# Patient Record
Sex: Female | Born: 1970 | Race: Black or African American | Hispanic: No | Marital: Married | Smoking: Never smoker
Health system: Southern US, Community
[De-identification: ages and names within clinical notes are randomized; demographics above are authoritative.]

## PROBLEM LIST (undated history)

## (undated) DIAGNOSIS — G4733 Obstructive sleep apnea (adult) (pediatric): Secondary | ICD-10-CM

## (undated) DIAGNOSIS — E669 Obesity, unspecified: Secondary | ICD-10-CM

## (undated) DIAGNOSIS — E785 Hyperlipidemia, unspecified: Secondary | ICD-10-CM

## (undated) DIAGNOSIS — R079 Chest pain, unspecified: Secondary | ICD-10-CM

## (undated) DIAGNOSIS — M542 Cervicalgia: Secondary | ICD-10-CM

## (undated) DIAGNOSIS — I471 Supraventricular tachycardia, unspecified: Secondary | ICD-10-CM

## (undated) DIAGNOSIS — J329 Chronic sinusitis, unspecified: Secondary | ICD-10-CM

## (undated) DIAGNOSIS — I10 Essential (primary) hypertension: Secondary | ICD-10-CM

## (undated) DIAGNOSIS — H5440 Blindness, one eye, unspecified eye: Secondary | ICD-10-CM

## (undated) DIAGNOSIS — G8929 Other chronic pain: Secondary | ICD-10-CM

## (undated) DIAGNOSIS — R51 Headache: Secondary | ICD-10-CM

## (undated) DIAGNOSIS — R519 Headache, unspecified: Secondary | ICD-10-CM

## (undated) DIAGNOSIS — G4730 Sleep apnea, unspecified: Secondary | ICD-10-CM

## (undated) HISTORY — DX: Chest pain, unspecified: R07.9

## (undated) HISTORY — DX: Chronic sinusitis, unspecified: J32.9

## (undated) HISTORY — PX: CATARACT EXTRACTION: SUR2

## (undated) HISTORY — DX: Obstructive sleep apnea (adult) (pediatric): G47.33

## (undated) HISTORY — DX: Headache: R51

## (undated) HISTORY — DX: Obesity, unspecified: E66.9

## (undated) HISTORY — DX: Headache, unspecified: R51.9

## (undated) HISTORY — PX: BREAST REDUCTION SURGERY: SHX8

## (undated) HISTORY — DX: Essential (primary) hypertension: I10

## (undated) HISTORY — DX: Hyperlipidemia, unspecified: E78.5

## (undated) HISTORY — DX: Supraventricular tachycardia: I47.1

## (undated) HISTORY — DX: Other chronic pain: G89.29

## (undated) HISTORY — DX: Cervicalgia: M54.2

## (undated) HISTORY — DX: Sleep apnea, unspecified: G47.30

## (undated) HISTORY — DX: Supraventricular tachycardia, unspecified: I47.10

## (undated) HISTORY — DX: Blindness, one eye, unspecified eye: H54.40

---

## 1997-08-29 ENCOUNTER — Inpatient Hospital Stay (HOSPITAL_COMMUNITY): Admission: AD | Admit: 1997-08-29 | Discharge: 1997-08-29 | Payer: Self-pay | Admitting: *Deleted

## 1997-08-30 ENCOUNTER — Inpatient Hospital Stay (HOSPITAL_COMMUNITY): Admission: AD | Admit: 1997-08-30 | Discharge: 1997-08-30 | Payer: Self-pay | Admitting: *Deleted

## 1998-08-09 ENCOUNTER — Inpatient Hospital Stay (HOSPITAL_COMMUNITY): Admission: AD | Admit: 1998-08-09 | Discharge: 1998-08-09 | Payer: Self-pay | Admitting: Obstetrics and Gynecology

## 1998-08-10 ENCOUNTER — Encounter: Payer: Self-pay | Admitting: Obstetrics and Gynecology

## 1998-08-10 ENCOUNTER — Ambulatory Visit (HOSPITAL_COMMUNITY): Admission: RE | Admit: 1998-08-10 | Discharge: 1998-08-10 | Payer: Self-pay | Admitting: *Deleted

## 1998-09-03 ENCOUNTER — Other Ambulatory Visit: Admission: RE | Admit: 1998-09-03 | Discharge: 1998-09-03 | Payer: Self-pay | Admitting: Gynecology

## 1999-03-01 ENCOUNTER — Encounter: Payer: Self-pay | Admitting: Obstetrics & Gynecology

## 1999-03-01 ENCOUNTER — Inpatient Hospital Stay (HOSPITAL_COMMUNITY): Admission: AD | Admit: 1999-03-01 | Discharge: 1999-03-01 | Payer: Self-pay | Admitting: Obstetrics and Gynecology

## 1999-03-03 ENCOUNTER — Inpatient Hospital Stay (HOSPITAL_COMMUNITY): Admission: AD | Admit: 1999-03-03 | Discharge: 1999-03-03 | Payer: Self-pay | Admitting: Obstetrics & Gynecology

## 1999-03-06 ENCOUNTER — Inpatient Hospital Stay (HOSPITAL_COMMUNITY): Admission: AD | Admit: 1999-03-06 | Discharge: 1999-03-06 | Payer: Self-pay | Admitting: Obstetrics & Gynecology

## 1999-03-11 ENCOUNTER — Inpatient Hospital Stay (HOSPITAL_COMMUNITY): Admission: AD | Admit: 1999-03-11 | Discharge: 1999-03-14 | Payer: Self-pay | Admitting: Obstetrics and Gynecology

## 2000-01-09 ENCOUNTER — Inpatient Hospital Stay (HOSPITAL_COMMUNITY): Admission: EM | Admit: 2000-01-09 | Discharge: 2000-01-09 | Payer: Self-pay | Admitting: Obstetrics and Gynecology

## 2000-03-09 ENCOUNTER — Encounter: Payer: Self-pay | Admitting: Internal Medicine

## 2000-03-09 ENCOUNTER — Ambulatory Visit (HOSPITAL_COMMUNITY): Admission: RE | Admit: 2000-03-09 | Discharge: 2000-03-09 | Payer: Self-pay | Admitting: Internal Medicine

## 2001-07-23 ENCOUNTER — Other Ambulatory Visit: Admission: RE | Admit: 2001-07-23 | Discharge: 2001-07-23 | Payer: Self-pay | Admitting: Obstetrics and Gynecology

## 2001-11-05 ENCOUNTER — Encounter (HOSPITAL_BASED_OUTPATIENT_CLINIC_OR_DEPARTMENT_OTHER): Payer: Self-pay | Admitting: General Surgery

## 2001-11-10 ENCOUNTER — Ambulatory Visit (HOSPITAL_COMMUNITY): Admission: RE | Admit: 2001-11-10 | Discharge: 2001-11-10 | Payer: Self-pay | Admitting: General Surgery

## 2001-11-10 ENCOUNTER — Encounter (INDEPENDENT_AMBULATORY_CARE_PROVIDER_SITE_OTHER): Payer: Self-pay | Admitting: Specialist

## 2002-09-23 ENCOUNTER — Other Ambulatory Visit: Admission: RE | Admit: 2002-09-23 | Discharge: 2002-09-23 | Payer: Self-pay | Admitting: Obstetrics and Gynecology

## 2004-02-12 ENCOUNTER — Ambulatory Visit (HOSPITAL_BASED_OUTPATIENT_CLINIC_OR_DEPARTMENT_OTHER): Admission: RE | Admit: 2004-02-12 | Discharge: 2004-02-12 | Payer: Self-pay | Admitting: *Deleted

## 2004-02-12 ENCOUNTER — Ambulatory Visit (HOSPITAL_COMMUNITY): Admission: RE | Admit: 2004-02-12 | Discharge: 2004-02-12 | Payer: Self-pay | Admitting: *Deleted

## 2004-07-08 ENCOUNTER — Encounter: Admission: RE | Admit: 2004-07-08 | Discharge: 2004-07-08 | Payer: Self-pay | Admitting: Internal Medicine

## 2004-12-26 ENCOUNTER — Inpatient Hospital Stay (HOSPITAL_COMMUNITY): Admission: AD | Admit: 2004-12-26 | Discharge: 2004-12-27 | Payer: Self-pay | Admitting: Obstetrics & Gynecology

## 2005-07-30 ENCOUNTER — Other Ambulatory Visit: Admission: RE | Admit: 2005-07-30 | Discharge: 2005-07-30 | Payer: Self-pay | Admitting: Obstetrics and Gynecology

## 2005-08-04 ENCOUNTER — Observation Stay (HOSPITAL_COMMUNITY): Admission: EM | Admit: 2005-08-04 | Discharge: 2005-08-05 | Payer: Self-pay | Admitting: Emergency Medicine

## 2005-09-03 ENCOUNTER — Encounter: Admission: RE | Admit: 2005-09-03 | Discharge: 2005-12-02 | Payer: Self-pay | Admitting: Obstetrics and Gynecology

## 2006-06-15 ENCOUNTER — Emergency Department (HOSPITAL_COMMUNITY): Admission: EM | Admit: 2006-06-15 | Discharge: 2006-06-15 | Payer: Self-pay | Admitting: Emergency Medicine

## 2006-12-23 ENCOUNTER — Ambulatory Visit: Payer: Self-pay | Admitting: Psychiatry

## 2006-12-30 ENCOUNTER — Ambulatory Visit: Payer: Self-pay | Admitting: Psychiatry

## 2007-02-01 ENCOUNTER — Ambulatory Visit: Payer: Self-pay | Admitting: Psychiatry

## 2007-03-03 ENCOUNTER — Ambulatory Visit: Payer: Self-pay | Admitting: Psychiatry

## 2007-03-16 ENCOUNTER — Ambulatory Visit: Payer: Self-pay | Admitting: Psychiatry

## 2008-10-17 ENCOUNTER — Encounter: Admission: RE | Admit: 2008-10-17 | Discharge: 2008-10-17 | Payer: Self-pay | Admitting: Obstetrics and Gynecology

## 2009-12-10 ENCOUNTER — Emergency Department (HOSPITAL_COMMUNITY): Admission: EM | Admit: 2009-12-10 | Discharge: 2009-12-11 | Payer: Self-pay | Admitting: Emergency Medicine

## 2010-08-26 ENCOUNTER — Emergency Department (HOSPITAL_COMMUNITY)
Admission: EM | Admit: 2010-08-26 | Discharge: 2010-08-26 | Disposition: A | Discharge status: Home / Self Care | Payer: 59 | Attending: Emergency Medicine | Admitting: Emergency Medicine

## 2010-08-26 ENCOUNTER — Emergency Department (HOSPITAL_COMMUNITY): Payer: 59

## 2010-08-26 DIAGNOSIS — I498 Other specified cardiac arrhythmias: Secondary | ICD-10-CM | POA: Insufficient documentation

## 2010-08-26 DIAGNOSIS — R079 Chest pain, unspecified: Secondary | ICD-10-CM | POA: Insufficient documentation

## 2010-08-26 DIAGNOSIS — M79609 Pain in unspecified limb: Secondary | ICD-10-CM

## 2010-08-26 DIAGNOSIS — E7800 Pure hypercholesterolemia, unspecified: Secondary | ICD-10-CM | POA: Insufficient documentation

## 2010-08-26 DIAGNOSIS — R209 Unspecified disturbances of skin sensation: Secondary | ICD-10-CM | POA: Insufficient documentation

## 2010-08-26 DIAGNOSIS — I4891 Unspecified atrial fibrillation: Secondary | ICD-10-CM | POA: Insufficient documentation

## 2010-08-26 DIAGNOSIS — R002 Palpitations: Secondary | ICD-10-CM | POA: Insufficient documentation

## 2010-08-26 DIAGNOSIS — I10 Essential (primary) hypertension: Secondary | ICD-10-CM | POA: Insufficient documentation

## 2010-08-26 LAB — DIFFERENTIAL
Eosinophils Absolute: 0.1 10*3/uL (ref 0.0–0.7)
Monocytes Relative: 8 % (ref 3–12)
Neutro Abs: 3.8 10*3/uL (ref 1.7–7.7)

## 2010-08-26 LAB — POCT I-STAT, CHEM 8
BUN: 8 mg/dL (ref 6–23)
Calcium, Ion: 1.23 mmol/L (ref 1.12–1.32)
Chloride: 108 mEq/L (ref 96–112)
Creatinine, Ser: 1.1 mg/dL (ref 0.4–1.2)
Glucose, Bld: 119 mg/dL — ABNORMAL HIGH (ref 70–99)
Potassium: 4.6 mEq/L (ref 3.5–5.1)
TCO2: 25 mmol/L (ref 0–100)

## 2010-08-26 LAB — POCT CARDIAC MARKERS
CKMB, poc: 1.4 ng/mL (ref 1.0–8.0)
CKMB, poc: <1 ng/mL — ABNORMAL LOW (ref 1.0–8.0)
Myoglobin, poc: 53.1 ng/mL (ref 12–200)
Myoglobin, poc: 54.5 ng/mL (ref 12–200)
Troponin i, poc: <0.05 ng/mL (ref 0.00–0.09)

## 2010-08-26 LAB — CBC
Hemoglobin: 11.4 g/dL — ABNORMAL LOW (ref 12.0–15.0)
MCV: 74.2 fL — ABNORMAL LOW (ref 78.0–100.0)
Platelets: 181 10*3/uL (ref 150–400)
RBC: 4.89 MIL/uL (ref 3.87–5.11)
RDW: 17 % — ABNORMAL HIGH (ref 11.5–15.5)

## 2010-08-26 LAB — URINALYSIS, ROUTINE W REFLEX MICROSCOPIC
Bilirubin Urine: NEGATIVE
Ketones, ur: NEGATIVE mg/dL
Nitrite: NEGATIVE

## 2010-09-08 LAB — URINE MICROSCOPIC-ADD ON

## 2010-09-08 LAB — COMPREHENSIVE METABOLIC PANEL
ALT: 20 U/L (ref 0–35)
AST: 21 U/L (ref 0–37)
Albumin: 3.9 g/dL (ref 3.5–5.2)
Alkaline Phosphatase: 78 U/L (ref 39–117)
CO2: 30 mEq/L (ref 19–32)
GFR calc Af Amer: >60 mL/min (ref >60)
Glucose, Bld: 154 mg/dL — ABNORMAL HIGH (ref 70–99)
Sodium: 140 mEq/L (ref 135–145)

## 2010-09-08 LAB — DIFFERENTIAL
Basophils Absolute: 0 10*3/uL (ref 0.0–0.1)
Lymphocytes Relative: 17 % (ref 12–46)
Monocytes Relative: 7 % (ref 3–12)
Neutro Abs: 4.9 10*3/uL (ref 1.7–7.7)
Neutrophils Relative %: 73 % (ref 43–77)

## 2010-09-08 LAB — CBC: WBC: 6.7 10*3/uL (ref 4.0–10.5)

## 2010-09-08 LAB — URINALYSIS, ROUTINE W REFLEX MICROSCOPIC: pH: 5.5 (ref 5.0–8.0)

## 2010-09-08 LAB — URINE CULTURE

## 2010-09-08 LAB — PREGNANCY, URINE: Preg Test, Ur: NEGATIVE

## 2010-11-08 NOTE — Op Note (Signed)
[place]  Patient:    Evelyn Johnson, Evelyn Johnson Visit Number: [id number] MRN: [id number]          Service Type: Attending:  Luisa Hart L. Lurene Shadow, M.D. Dictated by:   Mardene Celeste. Lurene Shadow, M.D. Proc. Date: 11/10/01                             Operative Report  PREOPERATIVE DIAGNOSIS:  Bilateral submammary hidradenitis.  POSTOPERATIVE DIAGNOSIS:  Bilateral submammary hidradenitis.  OPERATION PERFORMED:  Excision of bilateral submammary hidradenitis.  SURGEON:  Mardene Celeste. Lurene Shadow, M.D.  ASSISTANT:  Nurse.  ANESTHESIA:  General.  INDICATIONS FOR PROCEDURE:  The patient is a 40 year old woman status post reduction mammoplasties who has now developed hidradenitis in the bilateral submammary spaces.  She presented originally with severely draining sinuses from both of these areas.  These were treated with antibiotics and local care. The infection and inflammation is now well under control and she is brought to the operating room for excision of these areas.  DESCRIPTION OF PROCEDURE:  Following the induction of satisfactory anesthesia, the patient was positioned supinely.  The breasts and submammary spaces were prepped and draped to be included in the sterile operative field.  I approached the hidradenitis by making a large elliptical incision extending from the sternal border laterally in the submammary space, deepening this incision down through the skin and subcutaneous tissues carrying them all the way down to the pectoralis and rectus fascias.  The hidradenitis first starting on the left side was excised in its entirety, removed and forwarded for pathologic evaluation.  Hemostasis was assured with electrocautery.  There were no additional areas of hidradenitis that could be seen grossly.  The subcutaneous tissues were then closed with interrupted 3-0 Vicryl sutures and the skin was closed with a running 4-0 Monocryl suture and then reinforced with  Steri-Strips.  Attention was then turned to the right side where a similar long elliptical incision was made surrounding the entire area of hydradenitis deepening the incision down to the anterior rectus fascia and to the pectoralis muscles.  Excising this large ellipse of skin with the hydradenitis intact.  No additional hidradenitis was noted.  Hemostasis was assured with electrocautery.  Sponge, instrument and sharp counts were completely verified.  The subcutaneous tissues were reapproximated with interrupted 3-0 Vicryl sutures and the skin was closed with running 4-0 Monocryl suture and then reinforced with Steri-Strips.  Sterile dressings were applied.  Anesthetic reversed.  Patient removed from the operating room to the recovery room in stable condition having tolerated the procedure well. Dictated by:   Mardene Celeste. Lurene Shadow, M.D. Attending:  Mardene Celeste. Lurene Shadow, M.D. DD:  11/10/01 TD:  11/10/01 Job: [id number]

## 2010-11-08 NOTE — H&P (Signed)
[name], AMEY NO.:  [id number]   MEDICAL RECORD NO.:  [id number]          PATIENT TYPE:  EMS   LOCATION:  MAJO                         FACILITY:  [facility]   PHYSICIAN:  Georga Hacking, M.D.DATE OF BIRTH:  1971/04/26   DATE OF ADMISSION:  08/04/2005  DATE OF DISCHARGE:                                HISTORY & PHYSICAL   REASON FOR ADMISSION:  Supraventricular tachycardia post cardioversion.   HISTORY:  This 40 year old black female has previously been in good health  except for obesity as well as hypertension.  She states her blood pressure  has not been well controlled and has been elevated at her primary doctor's  office.  She has had some neck discomfort recently and has been undergoing  physical therapy and a question has been raised about whether she needs an  MRI.  She has had some numbness in her right hand over the past several  days.  Today she had the onset of acute shortness of breath, chest  discomfort, and felt poorly and her co-workers at work called EMS.  When  they arrived she was in a rapid supraventricular tachycardia in the field.  For some reason she underwent sedation with Versed and cardioversion x2 in  the field but when she presented to the [facility] Emergency Room it was  evident still in supraventricular tachycardia.  She received adenosine  intravenously and converted to sinus rhythm, later received some metoprolol  5 mg intravenous push and is currently in sinus rhythm.  She currently feels  much better.  She is not currently having chest pain and has negative  cardiac enzymes on her initial tracing.  Post tachycardia EKG was  unremarkable.  She has no previous history of tachycardia and no previous  cardiac history.   PAST MEDICAL HISTORY:  1.  Hypertension.  2.  Obesity.  3.  She has been having the trouble with her neck as also noted previously.   PAST SURGICAL HISTORY:  1.  She has had cataract removal from her left  eye and is legally blind in      her left eye.  2.  She has had a breast biopsy also previously.   ALLERGIES:  None.   CURRENT MEDICATIONS:  Norvasc 5 mg daily.   FAMILY HISTORY:  Mother is 96 with hypertension.  Father died of a stroke at  age 82.  There is a history of hypertension and cardiac disease in the  family.   SOCIAL HISTORY:  She is a nonsmoker and does not use alcohol to excess.  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  Her husband works in  Educational psychologist.  She has one child 57 years old and attends [employer].   REVIEW OF SYSTEMS:  Has been obese for several years.  She is legally blind  in her left eye.  She has no difficulty hearing.  She has no difficulty  swallowing.  She has had some upper respiratory infections.  She denies PND  or orthopnea.  No syncope, edema, or previous  palpitations or claudication.  She has noted taking ibuprofen that might have irritated her stomach.  She  has no genitourinary problems.  She has irregular periods and spotting with  marked irregularity.  She does not have any significant arthritis.  No  history of stroke or TIA.  Other than noted above, the remainder of review  of systems is unremarkable.   PHYSICAL EXAMINATION:  GENERAL:  She is an obese female who is pleasant,  alert, in no acute distress.  VITAL SIGNS:  Blood pressure is currently 101/52.  When she arrived it was  155/84.  SKIN:  Warm and dry.  HEENT:  EOMI.  The left pupil is cloudy.  Her pharynx is negative.  NECK:  Supple without masses, JVD, thyromegaly, or bruits.  LUNGS:  Clear.  CARDIAC:  Regular rhythm.  Normal S1, S2.  No S3 or murmur.  ABDOMEN:  Soft and nontender.  EXTREMITIES:  Distal pulses 2+.  No edema noted.   EKG per evaluation shows supraventricular tachycardia, marked ST  abnormality.  Post EKG shows minor nonspecific changes.  Laboratory work  showed normal CBC.  D-dimer was negative.  I-stat showed a glucose of  112,  otherwise unremarkable.  Initial point of care enzymes was completely  normal.   IMPRESSION:  1.  Supraventricular tachycardia which is resolved.  2.  Hypertension.  3.  Obesity.  4.  Neck pain consistent with cervical disk disease versus non-neurologic.   RECOMMENDATIONS:  The patient was cardioverted twice in the field without  having been given adenosine.  She did respond to adenosine.  I think she  needs to be observed overnight for recurrence to check serial enzymes.  While she is in will get an MRI.  She obviously will need to lose weight.  States she had thyroids done at her recent primary doctor's office.      Georga Hacking, M.D.  Electronically Signed     WST/MEDQ  D:  08/04/2005  T:  08/04/2005  Job:  [id number]   cc:   Juline Patch, M.D.  Fax: [phone]

## 2010-11-08 NOTE — Op Note (Signed)
NAME:  Evelyn Johnson, Evelyn Johnson                          ACCOUNT NO.:  [id number]   MEDICAL RECORD NO.:  [id number]                   PATIENT TYPE:  AMB   LOCATION:  DSC                                  FACILITY:  [facility]   PHYSICIAN:  Vikki Ports, M.D.         DATE OF BIRTH:  1971-06-05   DATE OF PROCEDURE:  02/12/2004  DATE OF DISCHARGE:                                 OPERATIVE REPORT   PREOPERATIVE DIAGNOSIS:  Hydradenitis suprativa of the right inframammary  region.   POSTOPERATIVE DIAGNOSIS:  Hydradenitis suprativa of the right inframammary  region.   PROCEDURE:  Excisions of hydradenitis of the right inframammary fold.   SURGEON:  Vikki Ports, M.D.   ANESTHESIA:  General.   DESCRIPTION OF PROCEDURE:  The patient was taken to the operating room and  placed in the supine position.  After adequate anesthesia was induced using  the endotracheal tube, the right breast and chest were prepped and draped in  the normal sterile fashion using an elliptical incision, excising the comedo  in the plain tissue in the medial aspect of the inframammary fold down to  the subcutaneous fat.  This was done en block.  Adequate hemostasis was  ensured and the skin was reapproximated using interrupted 3-0 nylon vertical  mattress sutures.  Sterile dressing was applied.  The patient tolerated the  procedure well and went to PACU in good condition.                                               Vikki Ports, M.D.    KRH/MEDQ  D:  02/12/2004  T:  02/12/2004  Job:  [id number]

## 2011-01-23 ENCOUNTER — Encounter: Payer: Self-pay | Admitting: Pulmonary Disease

## 2011-01-24 ENCOUNTER — Ambulatory Visit (INDEPENDENT_AMBULATORY_CARE_PROVIDER_SITE_OTHER): Payer: BC Managed Care – PPO | Admitting: Pulmonary Disease

## 2011-01-24 ENCOUNTER — Encounter: Payer: Self-pay | Admitting: Pulmonary Disease

## 2011-01-24 VITALS — BP 120/86 | HR 87 | Temp 98.3°F | Ht 64.0 in | Wt 224.2 lb

## 2011-01-24 DIAGNOSIS — R053 Chronic cough: Secondary | ICD-10-CM

## 2011-01-24 DIAGNOSIS — G4733 Obstructive sleep apnea (adult) (pediatric): Secondary | ICD-10-CM

## 2011-01-24 DIAGNOSIS — R059 Cough, unspecified: Secondary | ICD-10-CM

## 2011-01-24 DIAGNOSIS — R05 Cough: Secondary | ICD-10-CM

## 2011-01-24 MED ORDER — OMEPRAZOLE 20 MG PO CPDR: 20.0000 mg | DELAYED_RELEASE_CAPSULE | Freq: Every day | ORAL | Status: AC

## 2011-01-24 NOTE — Progress Notes (Signed)
  Subjective:    Patient ID: Evelyn Johnson, female    DOB: [birth year], 40 y.o.   MRN: [id number]  HPI 40/F ,never smoker referred for cough & snoring Cough has restarted x 3 mnths, ongoing x 65m, occ white phlegm tussionex liquid helps, makes her sleepy Prednisone, claritin, tussicaps, norel had no effect. No heartburn She has just moved from apt to house. ESS 13 Naps refreshing Long sleep latency - several hrs, takes cartia & crestor, flexeril (for neck spasms), hydrocodone for headaches. Took Ambien when father died. Bedtime 9.30 p, several awakenings, oob with child on school days, no daytime naps. She had a sleep study > 10 yrs ago & was given nasal spray for snoring. TSH , vitamin levels have been checked.  Review of Systems  Constitutional: Positive for unexpected weight change. Negative for fever.  HENT: Positive for congestion and sneezing. Negative for ear pain, nosebleeds, sore throat, rhinorrhea, trouble swallowing, dental problem, postnasal drip and sinus pressure.   Eyes: Negative for redness and itching.  Respiratory: Positive for cough and shortness of breath. Negative for chest tightness and wheezing.   Cardiovascular: Positive for palpitations. Negative for leg swelling.  Gastrointestinal: Negative for nausea and vomiting.  Genitourinary: Negative for dysuria.  Musculoskeletal: Positive for joint swelling.  Skin: Negative for rash.  Neurological: Positive for headaches.  Hematological: Does not bruise/bleed easily.  Psychiatric/Behavioral: Positive for dysphoric mood. The patient is nervous/anxious.        Objective:   Physical Exam Gen. Pleasant, well-nourished, in no distress, normal affect ENT - no lesions, no post nasal drip, swollen turbinates, class 3 airway Neck: No JVD, no thyromegaly, no carotid bruits Lungs: no use of accessory muscles, no dullness to percussion, clear without rales or rhonchi  Cardiovascular: Rhythm regular, heart sounds  normal, no  murmurs or gallops, no peripheral edema Abdomen: soft and non-tender, no hepatosplenomegaly, BS normal. Musculoskeletal: No deformities, no cyanosis or clubbing Neuro:  alert, non focal        Assessment & Plan:

## 2011-01-24 NOTE — Patient Instructions (Signed)
Sleep study at the lab Trial of omeprazole for acid suppression Nasonex spray each nare at bedtime - sample

## 2011-01-25 DIAGNOSIS — R053 Chronic cough: Secondary | ICD-10-CM | POA: Insufficient documentation

## 2011-01-25 DIAGNOSIS — R05 Cough: Secondary | ICD-10-CM | POA: Insufficient documentation

## 2011-01-25 NOTE — Assessment & Plan Note (Signed)
Trial of nasonex (swollen turbinates)  & PPI (omeprazole) x 4 weeks If no improvement consider allergy testing & antihistaminic- decongestant combination

## 2011-01-25 NOTE — Assessment & Plan Note (Signed)
Weight loss encouraged, compliance with goal of at least 4-6 hrs every night is the expectation. Advised against medications with sedative side effects Cautioned against driving when sleepy - understanding that sleepiness will vary on a day to day basis  

## 2011-01-30 ENCOUNTER — Encounter (HOSPITAL_BASED_OUTPATIENT_CLINIC_OR_DEPARTMENT_OTHER): Payer: Self-pay | Admitting: Emergency Medicine

## 2011-01-30 ENCOUNTER — Emergency Department (HOSPITAL_BASED_OUTPATIENT_CLINIC_OR_DEPARTMENT_OTHER)
Admission: EM | Admit: 2011-01-30 | Discharge: 2011-01-30 | Disposition: A | Discharge status: Home / Self Care | Payer: 59 | Attending: Emergency Medicine | Admitting: Emergency Medicine

## 2011-01-30 ENCOUNTER — Other Ambulatory Visit: Payer: Self-pay

## 2011-01-30 DIAGNOSIS — I471 Supraventricular tachycardia: Secondary | ICD-10-CM

## 2011-01-30 DIAGNOSIS — I10 Essential (primary) hypertension: Secondary | ICD-10-CM | POA: Insufficient documentation

## 2011-01-30 DIAGNOSIS — Z79899 Other long term (current) drug therapy: Secondary | ICD-10-CM | POA: Insufficient documentation

## 2011-01-30 DIAGNOSIS — E785 Hyperlipidemia, unspecified: Secondary | ICD-10-CM | POA: Insufficient documentation

## 2011-01-30 DIAGNOSIS — I498 Other specified cardiac arrhythmias: Secondary | ICD-10-CM | POA: Insufficient documentation

## 2011-01-30 LAB — CARDIAC PANEL(CRET KIN+CKTOT+MB+TROPI): Relative Index: 1.1 (ref 0.0–2.5)

## 2011-01-30 MED ORDER — ADENOSINE 6 MG/2ML IV SOLN
INTRAVENOUS | Status: AC
  Administered 2011-01-30: 6 mg via INTRAVENOUS
  Filled 2011-01-30: qty 6

## 2011-01-30 MED ORDER — GI COCKTAIL ~~LOC~~
30.0000 mL | Freq: Once | ORAL | Status: AC
  Administered 2011-01-30: 30 mL via ORAL
  Filled 2011-01-30: qty 30

## 2011-01-30 MED ORDER — ADENOSINE 6 MG/2ML IV SOLN
6.0000 mg | Freq: Once | INTRAVENOUS | Status: AC
  Administered 2011-01-30: 6 mg via INTRAVENOUS

## 2011-01-30 NOTE — ED Notes (Signed)
Pt to ED via EMS from MD office with c/o tachycardia/palpitations

## 2011-01-30 NOTE — ED Provider Notes (Signed)
History     CSN: [id number] Arrival date & time: 01/30/2011  6:47 PM  Chief Complaint  Patient presents with  . Tachycardia   The history is provided by the patient.  HPI This is a 40 year old female with a history of supraventricular tachycardia. She felt her heart rate increase about 5 hours ago consistent with past history of SVT. She presented to her primary care physician's office who was not comfortable treating her in his office and called EMS. EMS brought her here for definitive treatment. She's not having any chest pain she has some mild dyspnea and nausea no diaphoresis or vomiting. There no exacerbating or minute gating factors. The symptoms are mild to moderate.  Past Medical History  Diagnosis Date  . HTN (hypertension)   . Blind left eye   . Sinusitis   . Hyperlipidemia   . SVT (supraventricular tachycardia)     Past Surgical History  Procedure Date  . Breast reduction surgery     Family History  Problem Relation Age of Onset  . Stroke Father   . Breast cancer Maternal Grandmother     History  Substance Use Topics  . Smoking status: Never Smoker   . Smokeless tobacco: Not on file  . Alcohol Use: No    OB History    Grav Para Term Preterm Abortions TAB SAB Ect Mult Living                  Review of Systems  All other systems reviewed and are negative.    Physical Exam  BP 173/92  Pulse 183  Resp 20  SpO2 96%  LMP 08/30/2010  Physical Exam General: Well-developed, well-nourished female in no acute distress; appearance consistent with age of record HENT: normocephalic, atraumatic Eyes: pupils equal round and reactive to light; extraocular muscles intact Neck: supple Heart: regular rate and rhythm; tachycardic; monitor shows supraventricular tachycardia Lungs: clear to auscultation bilaterally Abdomen: soft; nontender; nondistended; no masses or hepatosplenomegaly; bowel sounds present Extremities: No deformity; full range of motion; pulses  normal; no edema Neurologic: Awake, alert and oriented;motor function intact in all extremities and symmetric;sensation grossly intact; no facial droop Skin: Warm and dry Psychiatric: Normal mood and affect   ED Course  CARDIOVERSION Date/Time: 01/30/2011 7:10 PM Performed by: Hanley Seamen Authorized by: Hanley Seamen Consent: Verbal consent obtained. Pre-procedure rhythm: supraventricular tachycardia Number of attempts: 1 Cardioversion mode attempt one: adenosine 6mg   IV push. Attempt 1 outcome: conversion to normal sinus rhythm Complications: no complications  Initial EKG: EKG Interpretation:  Date & Time: 01/30/2011 6:47 PM  Rate: 176  Rhythm: supraventricular tachycardia (SVT)  QRS Axis: normal  Intervals: normal  ST/T Wave abnormalities: nonspecific ST changes  Conduction Disutrbances: no P waves  Narrative Interpretation:   Old EKG Reviewed: previously NSR  Post cardioversion EKG: EKG Interpretation:  Date & Time: 01/30/2011 7:04 PM  Rate: 117  Rhythm: sinus tachycardia  QRS Axis: normal  Intervals: QT prolonged  ST/T Wave abnormalities: normal  Conduction Disutrbances:none  Narrative Interpretation:   Old EKG Reviewed: previously SVT  MDM Nursing notes and vitals signs, including pulse oximetry, reviewed.  Summary of this visit's results, reviewed by myself:  Labs:  Results for orders placed during the hospital encounter of 01/30/11  CARDIAC PANEL(CRET KIN+CKTOT+MB+TROPI)      Component Value Range   Total CK 158  7 - 177 (U/L)   CK, MB 1.8  0.3 - 4.0 (ng/mL)   Troponin I <0.30  <0.30 (  ng/mL)   Relative Index 1.1  0.0 - 2.5     Imaging Studies: No results found.

## 2011-01-30 NOTE — ED Notes (Signed)
Initial HR 186

## 2011-03-07 ENCOUNTER — Encounter (HOSPITAL_BASED_OUTPATIENT_CLINIC_OR_DEPARTMENT_OTHER): Payer: BC Managed Care – PPO

## 2011-03-31 ENCOUNTER — Emergency Department (HOSPITAL_COMMUNITY)
Admission: EM | Admit: 2011-03-31 | Discharge: 2011-03-31 | Disposition: A | Discharge status: Home / Self Care | Payer: 59 | Attending: Emergency Medicine | Admitting: Emergency Medicine

## 2011-03-31 DIAGNOSIS — Z79899 Other long term (current) drug therapy: Secondary | ICD-10-CM | POA: Insufficient documentation

## 2011-03-31 DIAGNOSIS — I10 Essential (primary) hypertension: Secondary | ICD-10-CM | POA: Insufficient documentation

## 2011-03-31 DIAGNOSIS — I498 Other specified cardiac arrhythmias: Secondary | ICD-10-CM | POA: Insufficient documentation

## 2011-03-31 LAB — POCT I-STAT, CHEM 8
BUN: 10 mg/dL (ref 6–23)
Creatinine, Ser: 1.1 mg/dL (ref 0.50–1.10)
Hemoglobin: 12.6 g/dL (ref 12.0–15.0)
Potassium: 3.8 mEq/L (ref 3.5–5.1)
Sodium: 143 mEq/L (ref 135–145)

## 2011-06-03 ENCOUNTER — Other Ambulatory Visit: Payer: Self-pay

## 2011-06-03 ENCOUNTER — Emergency Department (HOSPITAL_COMMUNITY): Payer: 59

## 2011-06-03 ENCOUNTER — Encounter (HOSPITAL_COMMUNITY): Payer: Self-pay | Admitting: *Deleted

## 2011-06-03 ENCOUNTER — Emergency Department (HOSPITAL_COMMUNITY)
Admission: EM | Admit: 2011-06-03 | Discharge: 2011-06-03 | Disposition: A | Discharge status: Home / Self Care | Payer: 59 | Attending: Emergency Medicine | Admitting: Emergency Medicine

## 2011-06-03 DIAGNOSIS — E785 Hyperlipidemia, unspecified: Secondary | ICD-10-CM | POA: Insufficient documentation

## 2011-06-03 DIAGNOSIS — I10 Essential (primary) hypertension: Secondary | ICD-10-CM | POA: Insufficient documentation

## 2011-06-03 DIAGNOSIS — I498 Other specified cardiac arrhythmias: Secondary | ICD-10-CM | POA: Insufficient documentation

## 2011-06-03 DIAGNOSIS — Z79899 Other long term (current) drug therapy: Secondary | ICD-10-CM | POA: Insufficient documentation

## 2011-06-03 DIAGNOSIS — R42 Dizziness and giddiness: Secondary | ICD-10-CM | POA: Insufficient documentation

## 2011-06-03 DIAGNOSIS — I471 Supraventricular tachycardia: Secondary | ICD-10-CM

## 2011-06-03 DIAGNOSIS — R0602 Shortness of breath: Secondary | ICD-10-CM | POA: Insufficient documentation

## 2011-06-03 DIAGNOSIS — R079 Chest pain, unspecified: Secondary | ICD-10-CM | POA: Insufficient documentation

## 2011-06-03 LAB — POCT I-STAT, CHEM 8
BUN: 16 mg/dL (ref 6–23)
Creatinine, Ser: 1.2 mg/dL — ABNORMAL HIGH (ref 0.50–1.10)
Hemoglobin: 14.6 g/dL (ref 12.0–15.0)
Potassium: 4.3 mEq/L (ref 3.5–5.1)
Sodium: 142 mEq/L (ref 135–145)

## 2011-06-03 LAB — BASIC METABOLIC PANEL
CO2: 26 mEq/L (ref 19–32)
Chloride: 103 mEq/L (ref 96–112)
GFR calc Af Amer: 73 mL/min — ABNORMAL LOW (ref >90)
Potassium: 4.4 mEq/L (ref 3.5–5.1)
Sodium: 138 mEq/L (ref 135–145)

## 2011-06-03 LAB — POCT I-STAT TROPONIN I: Troponin i, poc: 0.01 ng/mL (ref 0.00–0.08)

## 2011-06-03 MED ORDER — ADENOSINE 6 MG/2ML IV SOLN
INTRAVENOUS | Status: AC
  Administered 2011-06-03: 14:00:00
  Filled 2011-06-03: qty 4

## 2011-06-03 MED ORDER — ONDANSETRON HCL 4 MG/2ML IJ SOLN
4.0000 mg | Freq: Once | INTRAMUSCULAR | Status: AC
  Administered 2011-06-03: 4 mg via INTRAVENOUS
  Filled 2011-06-03: qty 2

## 2011-06-03 NOTE — ED Notes (Signed)
Pt denies c/o N/V.

## 2011-06-03 NOTE — ED Notes (Signed)
Pt reports cp x 3 hours, hx SVT. Has been here multiple xs for SVT.SOB w anxiety over "being in SVT"

## 2011-06-03 NOTE — ED Provider Notes (Addendum)
History     CSN: [id number] Arrival date & time: 06/03/2011  1:17 PM   First MD Initiated Contact with Patient 06/03/11 1326      Chief Complaint  Patient presents with  . Chest Pain   very pleasant 40 year old female with a known history of recurrent SVT, hypertension. She apparently is already on diltiazem and by systolic, as well as other medications. 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 This morning when her symptoms began approximately 3 hours ago. Symptoms mostly including palpitations, dizziness. Some mild shortness of breath, and mild chest discomfort. Patient has been converted, multiple times with adenosine in the past. She states this feels similar. Denies any numbness or tingling  (Consider location/radiation/quality/duration/timing/severity/associated sxs/prior treatment) HPI  Past Medical History  Diagnosis Date  . HTN (hypertension)   . Blind left eye   . Sinusitis   . Hyperlipidemia   . SVT (supraventricular tachycardia)     Past Surgical History  Procedure Date  . Breast reduction surgery     Family History  Problem Relation Age of Onset  . Stroke Father   . Breast cancer Maternal Grandmother     History  Substance Use Topics  . Smoking status: Never Smoker   . Smokeless tobacco: Not on file  . Alcohol Use: No    OB History    Grav Para Term Preterm Abortions TAB SAB Ect Mult Living                  Review of Systems  All other systems reviewed and are negative.    Allergies  Review of patient's allergies indicates no known allergies.  Home Medications   Current Outpatient Rx  Name Route Sig Dispense Refill  . CARISOPRODOL 350 MG PO TABS Oral Take 350 mg by mouth 3 (three) times daily as needed.      . CYCLOBENZAPRINE HCL 10 MG PO TABS Oral Take 10 mg by mouth 3 (three) times daily as needed. For muscle spasms     . DILTIAZEM HCL ER COATED BEADS 300 MG PO CP24 Oral Take 300 mg by mouth daily.     Carma Leaven M  PLUS PO TABS Oral Take 1 tablet by mouth daily.      . NEBIVOLOL HCL 10 MG PO TABS Oral Take 10 mg by mouth daily.     Marland Kitchen OMEPRAZOLE 20 MG PO CPDR Oral Take 1 capsule (20 mg total) by mouth daily. 60 capsule 1  . ROSUVASTATIN CALCIUM 5 MG PO TABS Oral Take 5 mg by mouth daily.      . TOBRAMYCIN-DEXAMETHASONE 0.3-0.1 % OP SUSP Left Eye Place 1 drop into the left eye 2 (two) times daily as needed. For eye irritation       BP 144/117  Pulse 185  Temp(Src) 97.1 F (36.2 C) (Oral)  Resp 22  Ht 5\' 5"  (1.651 m)  Wt 290 lb (131.543 kg)  BMI 48.26 kg/m2  SpO2 98%  Physical Exam  Nursing note and vitals reviewed. Constitutional: She is oriented to person, place, and time. She appears well-developed and well-nourished.  HENT:  Head: Normocephalic and atraumatic.  Eyes: Conjunctivae and EOM are normal. Pupils are equal, round, and reactive to light.  Neck: Neck supple.  Cardiovascular: Exam reveals no gallop and no friction rub.   No murmur heard.      Tachycardic rates. Appears to be regular. Pulses are normal.  Pulmonary/Chest: Breath sounds normal. She has no wheezes. She  has no rales. She exhibits no tenderness.  Abdominal: Soft. Bowel sounds are normal. She exhibits no distension. There is no tenderness. There is no rebound and no guarding.  Musculoskeletal: Normal range of motion.  Neurological: She is alert and oriented to person, place, and time. No cranial nerve deficit. Coordination normal.  Skin: Skin is warm and dry. No rash noted.  Psychiatric: She has a normal mood and affect.    ED Course  Procedures (including critical care time):  Cardioversion CRITICAL CARE Performed by: Valma Cava A.   Total critical care time: 30  Critical care time was exclusive of separately billable procedures and treating other patients.  Critical care was necessary to treat or prevent imminent or life-threatening deterioration.  Critical care was time spent personally by me on the  following activities: development of treatment plan with patient and/or surrogate as well as nursing, discussions with consultants, evaluation of patient's response to treatment, examination of patient, obtaining history from patient or surrogate, ordering and performing treatments and interventions, ordering and review of laboratory studies, ordering and review of radiographic studies, pulse oximetry and re-evaluation of patient's condition.   Labs Reviewed  BASIC METABOLIC PANEL - Abnormal; Notable for the following:    Glucose, Bld 108 (*)    GFR calc non Af Amer 63 (*)    GFR calc Af Amer 73 (*)    All other components within normal limits  POCT I-STAT, CHEM 8 - Abnormal; Notable for the following:    Creatinine, Ser 1.20 (*)    Glucose, Bld 107 (*)    All other components within normal limits  POCT I-STAT TROPONIN I  I-STAT TROPONIN I  I-STAT, CHEM 8   Dg Chest Port 1 View  06/03/2011  *RADIOLOGY REPORT*  Clinical Data: Supraventricular tachycardia.  Nonsmoker.  History of breast reduction 20 years ago.  PORTABLE CHEST - 1 VIEW  Comparison: 08/26/2010  Findings: Patient rotated to the right. Midline trachea.  Normal heart size.  No pleural effusion or pneumothorax.  Low lung volumes with resultant pulmonary interstitial prominence.  Clear lungs.  No congestive failure.  IMPRESSION: Low lung volumes without acute disease.  Original Report Authenticated By: Consuello Bossier, M.D.     No diagnosis found.    MDM  Pt is seen and examined;  Initial history and physical completed.  Will follow.          Amoura Ransier A. Patrica Duel, MD 06/03/11 1337  Procedures:  Chemical Cardioversion with Adenosine   3:07 PM Patient was given 6 mg of adenosine rapid IV push. She did convert from SVT to sinus tachycardia with a rate of 117 beats per minute. EKG also showed nonspecific ST changes and LVH. Will continue with current plan including   CRITICAL CARE Performed by: Valma Cava  A.   Total critical care time: 30  Critical care time was exclusive of separately billable procedures and treating other patients.  Critical care was necessary to treat or prevent imminent or life-threatening deterioration.  Critical care was time spent personally by me on the following activities: development of treatment plan with patient and/or surrogate as well as nursing, discussions with consultants, evaluation of patient's response to treatment, examination of patient, obtaining history from patient or surrogate, ordering and performing treatments and interventions, ordering and review of laboratory studies, ordering and review of radiographic studies, pulse oximetry and re-evaluation of patient's conditio   Results for orders placed during the hospital encounter of 06/03/11  BASIC METABOLIC PANEL  Component Value Range   Sodium 138  135 - 145 (mEq/L)   Potassium 4.4  3.5 - 5.1 (mEq/L)   Chloride 103  96 - 112 (mEq/L)   CO2 26  19 - 32 (mEq/L)   Glucose, Bld 108 (*) 70 - 99 (mg/dL)   BUN 14  6 - 23 (mg/dL)   Creatinine, Ser 9.60  0.50 - 1.10 (mg/dL)   Calcium 45.4  8.4 - 10.5 (mg/dL)   GFR calc non Af Amer 63 (*) >90 (mL/min)   GFR calc Af Amer 73 (*) >90 (mL/min)  POCT I-STAT, CHEM 8      Component Value Range   Sodium 142  135 - 145 (mEq/L)   Potassium 4.3  3.5 - 5.1 (mEq/L)   Chloride 106  96 - 112 (mEq/L)   BUN 16  6 - 23 (mg/dL)   Creatinine, Ser 0.98 (*) 0.50 - 1.10 (mg/dL)   Glucose, Bld 119 (*) 70 - 99 (mg/dL)   Calcium, Ion 1.47  8.29 - 1.32 (mmol/L)   TCO2 29  0 - 100 (mmol/L)   Hemoglobin 14.6  12.0 - 15.0 (g/dL)   HCT 56.2  13.0 - 86.5 (%)  POCT I-STAT TROPONIN I      Component Value Range   Troponin i, poc 0.01  0.00 - 0.08 (ng/mL)   Comment 3            Dg Chest Port 1 View  06/03/2011  *RADIOLOGY REPORT*  Clinical Data: Supraventricular tachycardia.  Nonsmoker.  History of breast reduction 20 years ago.  PORTABLE CHEST - 1 VIEW  Comparison:  08/26/2010  Findings: Patient rotated to the right. Midline trachea.  Normal heart size.  No pleural effusion or pneumothorax.  Low lung volumes with resultant pulmonary interstitial prominence.  Clear lungs.  No congestive failure.  IMPRESSION: Low lung volumes without acute disease.  Original Report Authenticated By: Consuello Bossier, M.D.    Patient remains stable, heart rate down to 100. Patient having mild nausea and was given Zofran. We'll continue to follow, anticipate discharge home  Sarajane Fambrough A. Patrica Duel, MD 06/03/11 1502  Theron Arista A. Patrica Duel, MD 06/03/11 1508   Date: 06/03/2011   13:22   Rate: 180  Rhythm: supraventricular tachycardia (SVT)  QRS Axis: normal  Intervals: normal  ST/T Wave abnormalities: nonspecific ST/T changes  Conduction Disutrbances:LVH  Narrative Interpretation:   Old EKG Reviewed: changes noted    Date: 06/03/2011             13:42   Rate: 117  Rhythm: sinus tachycardia  QRS Axis: normal  Intervals: normal  ST/T Wave abnormalities: nonspecific ST/T changes  Conduction Disutrbances:none and LVH  Narrative Interpretation:   Old EKG Reviewed: changes noted      Angelina Neece A. Patrica Duel, MD 06/03/11 1525   3:45 PM Dr. Michaelle Copas office was contacted. Dr. Katrinka Blazing was not readily available, however, the office staff stated they would contact the patient home to arrange close outpatient followup. This week. Patient is stable, jovial, heart rate normalized. No acute distress. Will be discharged home in stable improved condition  Brynlea Spindler A. Patrica Duel, MD 06/03/11 1545

## 2012-01-03 ENCOUNTER — Emergency Department (HOSPITAL_BASED_OUTPATIENT_CLINIC_OR_DEPARTMENT_OTHER)
Admission: EM | Admit: 2012-01-03 | Discharge: 2012-01-03 | Disposition: A | Discharge status: Home / Self Care | Payer: 59 | Attending: Emergency Medicine | Admitting: Emergency Medicine

## 2012-01-03 ENCOUNTER — Encounter (HOSPITAL_BASED_OUTPATIENT_CLINIC_OR_DEPARTMENT_OTHER): Payer: Self-pay | Admitting: Emergency Medicine

## 2012-01-03 DIAGNOSIS — I10 Essential (primary) hypertension: Secondary | ICD-10-CM | POA: Insufficient documentation

## 2012-01-03 DIAGNOSIS — R000 Tachycardia, unspecified: Secondary | ICD-10-CM | POA: Insufficient documentation

## 2012-01-03 DIAGNOSIS — F411 Generalized anxiety disorder: Secondary | ICD-10-CM | POA: Insufficient documentation

## 2012-01-03 DIAGNOSIS — R002 Palpitations: Secondary | ICD-10-CM | POA: Insufficient documentation

## 2012-01-03 DIAGNOSIS — R42 Dizziness and giddiness: Secondary | ICD-10-CM | POA: Insufficient documentation

## 2012-01-03 DIAGNOSIS — I471 Supraventricular tachycardia: Secondary | ICD-10-CM

## 2012-01-03 DIAGNOSIS — E785 Hyperlipidemia, unspecified: Secondary | ICD-10-CM | POA: Insufficient documentation

## 2012-01-03 LAB — COMPREHENSIVE METABOLIC PANEL
ALT: 21 U/L (ref 0–35)
AST: 26 U/L (ref 0–37)
Albumin: 3.8 g/dL (ref 3.5–5.2)
Alkaline Phosphatase: 93 U/L (ref 39–117)
BUN: 14 mg/dL (ref 6–23)
Chloride: 108 mEq/L (ref 96–112)
Potassium: 4.4 mEq/L (ref 3.5–5.1)
Sodium: 142 mEq/L (ref 135–145)
Total Bilirubin: 0.1 mg/dL — ABNORMAL LOW (ref 0.3–1.2)
Total Protein: 7.8 g/dL (ref 6.0–8.3)

## 2012-01-03 LAB — CBC WITH DIFFERENTIAL/PLATELET
Basophils Absolute: 0.1 10*3/uL (ref 0.0–0.1)
Eosinophils Absolute: 0.2 10*3/uL (ref 0.0–0.7)
HCT: 38.1 % (ref 36.0–46.0)
Hemoglobin: 12.5 g/dL (ref 12.0–15.0)
Lymphocytes Relative: 34 % (ref 12–46)
Monocytes Relative: 7 % (ref 3–12)
Neutro Abs: 3.3 10*3/uL (ref 1.7–7.7)
Neutrophils Relative %: 55 % (ref 43–77)
RDW: 17.8 % — ABNORMAL HIGH (ref 11.5–15.5)
WBC: 6 10*3/uL (ref 4.0–10.5)

## 2012-01-03 LAB — PROTIME-INR: Prothrombin Time: 13.6 seconds (ref 11.6–15.2)

## 2012-01-03 MED ORDER — ADENOSINE 6 MG/2ML IV SOLN: 6.0000 mg | Freq: Once | INTRAVENOUS | Status: DC

## 2012-01-03 MED ORDER — SODIUM CHLORIDE 0.9 % IV SOLN
Freq: Once | INTRAVENOUS | Status: AC
  Administered 2012-01-03: 21:00:00 via INTRAVENOUS

## 2012-01-03 NOTE — ED Notes (Signed)
Patient submerged face in bucket of ice.  SVT went to ST at a rate of 112. Patient denies SOB, dizziness, or nausea.

## 2012-01-03 NOTE — ED Provider Notes (Signed)
History   This chart was scribed for Gerhard Munch, MD by Shari Heritage. The patient was seen in room MH09/MH09. Patient's care was started at 2052.     CSN: [id number]  Arrival date & time 01/03/12  2052   First MD Initiated Contact with Patient 01/03/12 2058      Chief Complaint  Patient presents with  . Palpitations    (Consider location/radiation/quality/duration/timing/severity/associated sxs/prior treatment) Patient is a 41 y.o. female presenting with palpitations. The history is provided by the patient. No language interpreter was used.  Palpitations  This is a recurrent problem. The current episode started 6 to 12 hours ago. The problem occurs constantly. The problem has not changed since onset.The problem is associated with anxiety and stress. Associated symptoms include nausea and dizziness. Pertinent negatives include no fever, no chest pain, no near-syncope, no syncope, no back pain, no cough and no shortness of breath. Risk factors: HTN. Her past medical history does not include heart disease or hyperthyroidism. Past medical history comments: SVT.   Evelyn Johnson is a 41 y.o. female who presents to the Emergency Department complaining of rapid heart beat (palpitations) with associated dizziness and nausea onset 6 hours ago. Patient says that she attempted several home remedy maneuvers today without relief. Patient says that this episode is very similar to past SVT episodes. Patient states that she suffers from depression and so she stays at home a lot. 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 Patient says that she also experiences throbbing in her left eye. Patient states that needs a left eye removal. Patient with h/o SVT (most recent in December 2012), HTN, left eye blindness, neck spasms, depression, sinusitis, and hyperlipidemia. Patient with surgical h/o breat reduction surgery.  Past Medical History  Diagnosis Date  . HTN (hypertension)   .  Blind left eye   . Sinusitis   . Hyperlipidemia   . SVT (supraventricular tachycardia)     Past Surgical History  Procedure Date  . Breast reduction surgery     Family History  Problem Relation Age of Onset  . Stroke Father   . Breast cancer Maternal Grandmother     History  Substance Use Topics  . Smoking status: Never Smoker   . Smokeless tobacco: Not on file  . Alcohol Use: No    OB History    Grav Para Term Preterm Abortions TAB SAB Ect Mult Living                  Review of Systems  Constitutional: Negative for fever.  HENT: Negative for congestion.   Respiratory: Negative for cough and shortness of breath.   Cardiovascular: Positive for palpitations. Negative for chest pain, syncope and near-syncope.  Gastrointestinal: Positive for nausea.  Genitourinary: Negative for flank pain.  Musculoskeletal: Negative for back pain.  Skin: Negative for rash.  Neurological: Positive for dizziness.  Psychiatric/Behavioral: The patient is not nervous/anxious.     Allergies  Review of patient's allergies indicates no known allergies.  Home Medications   Current Outpatient Rx  Name Route Sig Dispense Refill  . CARISOPRODOL 350 MG PO TABS Oral Take 350 mg by mouth 3 (three) times daily as needed. For muscle spasms    . CYCLOBENZAPRINE HCL 10 MG PO TABS Oral Take 10 mg by mouth 3 (three) times daily as needed. For muscle spasms     . DILTIAZEM HCL ER 240 MG PO CP24 Oral Take 240 mg by mouth daily.    Marland Kitchen  THERA M PLUS PO TABS Oral Take 1 tablet by mouth daily.      . NEBIVOLOL HCL 10 MG PO TABS Oral Take 10 mg by mouth daily.     Marland Kitchen OMEPRAZOLE 20 MG PO CPDR Oral Take 1 capsule (20 mg total) by mouth daily. 60 capsule 1  . ROSUVASTATIN CALCIUM 5 MG PO TABS Oral Take 5 mg by mouth daily.      . TOBRAMYCIN-DEXAMETHASONE 0.3-0.1 % OP SUSP Left Eye Place 1 drop into the left eye 2 (two) times daily as needed. For eye pain      BP 157/126  Pulse 178  Temp 98.1 F (36.7 C)  (Oral)  Resp 16  Ht 5\' 5"  (1.651 m)  SpO2 99%  Physical Exam  Nursing note and vitals reviewed. Constitutional: She is oriented to person, place, and time. She appears well-developed and well-nourished. No distress.  HENT:  Head: Normocephalic and atraumatic.  Eyes: Conjunctivae and EOM are normal.  Cardiovascular: Regular rhythm.  Tachycardia present.   Pulmonary/Chest: Effort normal and breath sounds normal. No stridor. No respiratory distress.  Abdominal: She exhibits no distension.  Musculoskeletal: She exhibits no edema.  Neurological: She is alert and oriented to person, place, and time. No cranial nerve deficit.  Skin: Skin is warm and dry.  Psychiatric: She has a normal mood and affect.    ED Course  Procedures (including critical care time) DIAGNOSTIC STUDIES: Oxygen Saturation is 99% on room air, normal by my interpretation.    Cardiac: 171 - svt   Date: 01/03/2012 #1  Rate: 172  Rhythm: supraventricular tachycardia (SVT)  QRS Axis: normal  Intervals: svt  ST/T Wave abnormalities: nonspecific ST/T changes  Conduction Disutrbances:nonspecific intraventricular conduction delay  Narrative Interpretation:   Old EKG Reviewed: changes noted ABNORMAL   COORDINATION OF CARE: 9:03PM- Patient informed of current plan for treatment and evaluation and agrees with plan at this time. Will order labs and EKG. Patient's HR upon arrival was 178.  10:09PM- Patient was converted with ice water. Patient's HR is now 103.    Date: 01/03/2012 #2  Rate: 96  Rhythm: normal sinus rhythm  QRS Axis: normal  Intervals: QT prolonged  ST/T Wave abnormalities: nonspecific ST/T changes  Conduction Disutrbances:nonspecific intraventricular conduction delay  Narrative Interpretation:   Old EKG Reviewed: changes noted ABNORMAL - CHANGED    Results for orders placed during the hospital encounter of 01/03/12  CBC WITH DIFFERENTIAL      Component Value Range   WBC 6.0  4.0 - 10.5 K/uL     RBC 5.21 (*) 3.87 - 5.11 MIL/uL   Hemoglobin 12.5  12.0 - 15.0 g/dL   HCT 46.9  62.9 - 52.8 %   MCV 73.1 (*) 78.0 - 100.0 fL   MCH 24.0 (*) 26.0 - 34.0 pg   MCHC 32.8  30.0 - 36.0 g/dL   RDW 41.3 (*) 24.4 - 01.0 %   Platelets 202  150 - 400 K/uL   Neutrophils Relative 55  43 - 77 %   Lymphocytes Relative 34  12 - 46 %   Monocytes Relative 7  3 - 12 %   Eosinophils Relative 3  0 - 5 %   Basophils Relative 1  0 - 1 %   Neutro Abs 3.3  1.7 - 7.7 K/uL   Lymphs Abs 2.0  0.7 - 4.0 K/uL   Monocytes Absolute 0.4  0.1 - 1.0 K/uL   Eosinophils Absolute 0.2  0.0 - 0.7 K/uL  Basophils Absolute 0.1  0.0 - 0.1 K/uL   Smear Review PLATELET COUNT CONFIRMED BY SMEAR    COMPREHENSIVE METABOLIC PANEL      Component Value Range   Sodium 142  135 - 145 mEq/L   Potassium 4.4  3.5 - 5.1 mEq/L   Chloride 108  96 - 112 mEq/L   CO2 23  19 - 32 mEq/L   Glucose, Bld 141 (*) 70 - 99 mg/dL   BUN 14  6 - 23 mg/dL   Creatinine, Ser 1.61 (*) 0.50 - 1.10 mg/dL   Calcium 9.8  8.4 - 09.6 mg/dL   Total Protein 7.8  6.0 - 8.3 g/dL   Albumin 3.8  3.5 - 5.2 g/dL   AST 26  0 - 37 U/L   ALT 21  0 - 35 U/L   Alkaline Phosphatase 93  39 - 117 U/L   Total Bilirubin 0.1 (*) 0.3 - 1.2 mg/dL   GFR calc non Af Amer 55 (*) >90 mL/min   GFR calc Af Amer 64 (*) >90 mL/min  PROTIME-INR      Component Value Range   Prothrombin Time 13.6  11.6 - 15.2 seconds   INR 1.02  0.00 - 1.49  APTT      Component Value Range   aPTT 33  24 - 37 seconds    No results found.   No diagnosis found.    MDM  I personally performed the services described in this documentation, which was scribed in my presence. The recorded information has been reviewed and considered.  This 41 year old female with history of prior episodes of SVT now presents with ongoing palpitations.  On my initial exam the patient is notably tachycardic, with EKG consistent with SVT.  The patient is mentating clearly, speaking competently.  Given the lack of  distress, the patient had labs to evaluate for possible electrolyte abnormalities well preparing for adenosine cardioversion.  The patient received ice packs for a hopeful vagal response. this achieved cardioversion, and the patient did not require adenosine.  She was discharged in stable condition after the completion of IV fluids.  Gerhard Munch, MD 01/03/12 2237

## 2012-01-03 NOTE — ED Notes (Signed)
States started having a rapid heart beat, dizziness, and nausea around 3pm.  Has tried several home remedy maneuvers without relief.

## 2012-01-22 ENCOUNTER — Other Ambulatory Visit: Payer: 59

## 2012-01-22 ENCOUNTER — Other Ambulatory Visit: Payer: Self-pay | Admitting: Internal Medicine

## 2012-01-22 DIAGNOSIS — R413 Other amnesia: Secondary | ICD-10-CM

## 2012-01-29 ENCOUNTER — Ambulatory Visit
Admission: RE | Admit: 2012-01-29 | Discharge: 2012-01-29 | Disposition: A | Discharge status: Home / Self Care | Payer: 59 | Source: Ambulatory Visit | Attending: Internal Medicine | Admitting: Internal Medicine

## 2012-01-29 DIAGNOSIS — R413 Other amnesia: Secondary | ICD-10-CM

## 2012-01-29 MED ORDER — GADOBENATE DIMEGLUMINE 529 MG/ML IV SOLN
20.0000 mL | Freq: Once | INTRAVENOUS | Status: AC | PRN
  Administered 2012-01-29: 20 mL via INTRAVENOUS

## 2012-02-04 ENCOUNTER — Emergency Department (HOSPITAL_BASED_OUTPATIENT_CLINIC_OR_DEPARTMENT_OTHER): Payer: 59

## 2012-02-04 ENCOUNTER — Emergency Department (HOSPITAL_BASED_OUTPATIENT_CLINIC_OR_DEPARTMENT_OTHER)
Admission: EM | Admit: 2012-02-04 | Discharge: 2012-02-05 | Disposition: A | Discharge status: Home / Self Care | Payer: 59 | Attending: Emergency Medicine | Admitting: Emergency Medicine

## 2012-02-04 ENCOUNTER — Encounter (HOSPITAL_BASED_OUTPATIENT_CLINIC_OR_DEPARTMENT_OTHER): Payer: Self-pay | Admitting: Emergency Medicine

## 2012-02-04 DIAGNOSIS — H5440 Blindness, one eye, unspecified eye: Secondary | ICD-10-CM | POA: Insufficient documentation

## 2012-02-04 DIAGNOSIS — E785 Hyperlipidemia, unspecified: Secondary | ICD-10-CM | POA: Insufficient documentation

## 2012-02-04 DIAGNOSIS — R079 Chest pain, unspecified: Secondary | ICD-10-CM

## 2012-02-04 DIAGNOSIS — R42 Dizziness and giddiness: Secondary | ICD-10-CM | POA: Insufficient documentation

## 2012-02-04 DIAGNOSIS — I10 Essential (primary) hypertension: Secondary | ICD-10-CM | POA: Insufficient documentation

## 2012-02-04 LAB — COMPREHENSIVE METABOLIC PANEL
Alkaline Phosphatase: 92 U/L (ref 39–117)
BUN: 11 mg/dL (ref 6–23)
Chloride: 103 mEq/L (ref 96–112)
Creatinine, Ser: 0.9 mg/dL (ref 0.50–1.10)
GFR calc Af Amer: >90 mL/min (ref >90)
Glucose, Bld: 121 mg/dL — ABNORMAL HIGH (ref 70–99)
Potassium: 4.2 mEq/L (ref 3.5–5.1)
Total Bilirubin: 0.2 mg/dL — ABNORMAL LOW (ref 0.3–1.2)

## 2012-02-04 LAB — CBC WITH DIFFERENTIAL/PLATELET
Basophils Relative: 1 % (ref 0–1)
Eosinophils Absolute: 0.1 10*3/uL (ref 0.0–0.7)
HCT: 34.2 % — ABNORMAL LOW (ref 36.0–46.0)
Hemoglobin: 11.3 g/dL — ABNORMAL LOW (ref 12.0–15.0)
Lymphs Abs: 1.7 10*3/uL (ref 0.7–4.0)
MCH: 24.4 pg — ABNORMAL LOW (ref 26.0–34.0)
MCHC: 33 g/dL (ref 30.0–36.0)
Monocytes Absolute: 0.5 10*3/uL (ref 0.1–1.0)
Monocytes Relative: 10 % (ref 3–12)
Neutro Abs: 2.9 10*3/uL (ref 1.7–7.7)
Neutrophils Relative %: 55 % (ref 43–77)
RBC: 4.64 MIL/uL (ref 3.87–5.11)

## 2012-02-04 LAB — TROPONIN I: Troponin I: <0.3 ng/mL (ref <0.30)

## 2012-02-04 LAB — PREGNANCY, URINE: Preg Test, Ur: NEGATIVE

## 2012-02-04 LAB — URINALYSIS, ROUTINE W REFLEX MICROSCOPIC
Ketones, ur: NEGATIVE mg/dL
Nitrite: NEGATIVE
Protein, ur: NEGATIVE mg/dL
Urobilinogen, UA: 0.2 mg/dL (ref 0.0–1.0)

## 2012-02-04 LAB — URINE MICROSCOPIC-ADD ON

## 2012-02-04 LAB — LIPASE, BLOOD: Lipase: 31 U/L (ref 11–59)

## 2012-02-04 MED ORDER — NITROGLYCERIN 0.4 MG SL SUBL
0.4000 mg | SUBLINGUAL_TABLET | SUBLINGUAL | Status: DC | PRN
  Administered 2012-02-04 (×3): 0.4 mg via SUBLINGUAL
  Filled 2012-02-04: qty 25

## 2012-02-04 MED ORDER — ONDANSETRON HCL 4 MG/2ML IJ SOLN
4.0000 mg | Freq: Once | INTRAMUSCULAR | Status: AC
  Administered 2012-02-04: 4 mg via INTRAVENOUS
  Filled 2012-02-04: qty 2

## 2012-02-04 MED ORDER — SODIUM CHLORIDE 0.9 % IV BOLUS (SEPSIS)
250.0000 mL | Freq: Once | INTRAVENOUS | Status: AC
  Administered 2012-02-04: 22:00:00 via INTRAVENOUS

## 2012-02-04 MED ORDER — ASPIRIN 81 MG PO CHEW
324.0000 mg | CHEWABLE_TABLET | Freq: Once | ORAL | Status: AC
Start: 2012-02-04 — End: 2012-02-04
  Administered 2012-02-04: 324 mg via ORAL
  Filled 2012-02-04: qty 4

## 2012-02-04 MED ORDER — SODIUM CHLORIDE 0.9 % IV SOLN: INTRAVENOUS | Status: DC

## 2012-02-04 NOTE — ED Notes (Addendum)
Sharp right sided cp started 1 hr ago.  No hx.  Denies radiation. No nausea.  Slight dizziness and lightheadness. Pt also c/o sharp pain in left abdomen x2 days. Loose stool x24 hrs.

## 2012-02-05 NOTE — ED Notes (Addendum)
MD at bedside explaining the risks of leaving AMA to pt.  Pt continues to insist that she is going home at this time and will follow up with her pmd tomorrow. Pt is alert and oriented.

## 2012-02-05 NOTE — ED Provider Notes (Signed)
History     CSN: [id number]  Arrival date & time 02/04/12  2011   First MD Initiated Contact with Patient 02/04/12 2054      Chief Complaint  Patient presents with  . Chest Pain    (Consider location/radiation/quality/duration/timing/severity/associated sxs/prior treatment) Patient is a 41 y.o. female presenting with chest pain.  Chest Pain Primary symptoms include nausea. Pertinent negatives for primary symptoms include no fever, no shortness of breath, no cough, no abdominal pain, no vomiting and no dizziness.  Pertinent negatives for associated symptoms include no diaphoresis.    patient is a 41 year old female with onset of chest pain 2 hours prior to me seeing her. Was a right-sided chest pain not radiating sharp and throbbing constant rated as a 10 out of 10 no shortness of breath associated with nausea and lightheadedness. No vomiting. No history of chest pain in the past does have a risk factor of hypertension no family history of premature coronary disease. Patient does have a primary care doctor does not have a cardiologist.  Past Medical History  Diagnosis Date  . HTN (hypertension)   . Blind left eye   . Sinusitis   . Hyperlipidemia   . SVT (supraventricular tachycardia)     Past Surgical History  Procedure Date  . Breast reduction surgery   . Cataract extraction     Family History  Problem Relation Age of Onset  . Stroke Father   . Breast cancer Maternal Grandmother     History  Substance Use Topics  . Smoking status: Never Smoker   . Smokeless tobacco: Not on file  . Alcohol Use: No    OB History    Grav Para Term Preterm Abortions TAB SAB Ect Mult Living                  Review of Systems  Constitutional: Negative for fever, chills and diaphoresis.  HENT: Negative for neck pain.   Eyes: Negative for visual disturbance.  Respiratory: Negative for cough and shortness of breath.   Cardiovascular: Positive for chest pain.  Gastrointestinal:  Positive for nausea. Negative for vomiting, abdominal pain and diarrhea.  Genitourinary: Negative for dysuria.  Musculoskeletal: Negative for back pain.  Skin: Negative for rash.  Neurological: Positive for light-headedness. Negative for dizziness and headaches.  Hematological: Does not bruise/bleed easily.    Allergies  Review of patient's allergies indicates no known allergies.  Home Medications   Current Outpatient Rx  Name Route Sig Dispense Refill  . CYCLOBENZAPRINE HCL 10 MG PO TABS Oral Take 10 mg by mouth 3 (three) times daily as needed. For muscle spasms    . DILTIAZEM HCL ER 240 MG PO CP24 Oral Take 240 mg by mouth daily.    Carma Leaven M PLUS PO TABS Oral Take 1 tablet by mouth daily.     . NEBIVOLOL HCL 10 MG PO TABS Oral Take 10 mg by mouth daily.     Marland Kitchen CARISOPRODOL 350 MG PO TABS Oral Take 350 mg by mouth 3 (three) times daily as needed. For muscle spasms    . OMEPRAZOLE 20 MG PO CPDR Oral Take 1 capsule (20 mg total) by mouth daily. 60 capsule 1  . TOBRAMYCIN-DEXAMETHASONE 0.3-0.1 % OP SUSP Left Eye Place 1 drop into the left eye 2 (two) times daily as needed. For eye pain      BP 117/66  Pulse 62  Temp 98.3 F (36.8 C)  Resp 16  Ht 5\' 5"  (1.651 m)  Wt 234 lb (106.142 kg)  BMI 38.94 kg/m2  SpO2 100%  Physical Exam  Nursing note and vitals reviewed. Constitutional: She is oriented to person, place, and time. She appears well-developed and well-nourished. No distress.  HENT:  Head: Normocephalic and atraumatic.  Mouth/Throat: Oropharynx is clear and moist.  Eyes: Conjunctivae and EOM are normal. Pupils are equal, round, and reactive to light.  Neck: Normal range of motion. Neck supple.  Cardiovascular: Normal rate, regular rhythm, normal heart sounds and intact distal pulses.   No murmur heard. Pulmonary/Chest: Effort normal and breath sounds normal. No respiratory distress. She has no wheezes. She has no rales. She exhibits no tenderness.  Abdominal: Soft. Bowel  sounds are normal. There is no tenderness.  Musculoskeletal: Normal range of motion. She exhibits no edema and no tenderness.  Neurological: She is alert and oriented to person, place, and time. No cranial nerve deficit. She exhibits normal muscle tone. Coordination normal.  Skin: Skin is warm. No rash noted.    ED Course  Procedures (including critical care time)  Labs Reviewed  URINALYSIS, ROUTINE W REFLEX MICROSCOPIC - Abnormal; Notable for the following:    Leukocytes, UA TRACE (*)     All other components within normal limits  CBC WITH DIFFERENTIAL - Abnormal; Notable for the following:    Hemoglobin 11.3 (*)     HCT 34.2 (*)     MCV 73.7 (*)     MCH 24.4 (*)     RDW 17.3 (*)     All other components within normal limits  COMPREHENSIVE METABOLIC PANEL - Abnormal; Notable for the following:    Glucose, Bld 121 (*)     Total Bilirubin 0.2 (*)     GFR calc non Af Amer 78 (*)     All other components within normal limits  PREGNANCY, URINE  URINE MICROSCOPIC-ADD ON  D-DIMER, QUANTITATIVE  LIPASE, BLOOD  TROPONIN I   Dg Chest 2 View  02/04/2012  *RADIOLOGY REPORT*  Clinical Data: Chest pain  CHEST - 2 VIEW  Comparison: 06/03/2011  Findings: Lungs are clear.  Cardiomediastinal silhouette is within normal.  There is mild spondylosis of the spine with subtle biphasic curvature.  IMPRESSION: No acute cardiopulmonary disease.  Original Report Authenticated By: Elba Barman, M.D.   Results for orders placed during the hospital encounter of 02/04/12  URINALYSIS, ROUTINE W REFLEX MICROSCOPIC      Component Value Range   Color, Urine YELLOW  YELLOW   APPearance CLEAR  CLEAR   Specific Gravity, Urine 1.027  1.005 - 1.030   pH 6.0  5.0 - 8.0   Glucose, UA NEGATIVE  NEGATIVE mg/dL   Hgb urine dipstick NEGATIVE  NEGATIVE   Bilirubin Urine NEGATIVE  NEGATIVE   Ketones, ur NEGATIVE  NEGATIVE mg/dL   Protein, ur NEGATIVE  NEGATIVE mg/dL   Urobilinogen, UA 0.2  0.0 - 1.0 mg/dL    Nitrite NEGATIVE  NEGATIVE   Leukocytes, UA TRACE (*) NEGATIVE  PREGNANCY, URINE      Component Value Range   Preg Test, Ur NEGATIVE  NEGATIVE  URINE MICROSCOPIC-ADD ON      Component Value Range   Squamous Epithelial / LPF RARE  RARE   WBC, UA 0-2  <3 WBC/hpf   Bacteria, UA RARE  RARE  D-DIMER, QUANTITATIVE      Component Value Range   D-Dimer, Quant <0.22  0.00 - 0.48 ug/mL-FEU  CBC WITH DIFFERENTIAL      Component Value Range  WBC 5.3  4.0 - 10.5 K/uL   RBC 4.64  3.87 - 5.11 MIL/uL   Hemoglobin 11.3 (*) 12.0 - 15.0 g/dL   HCT 16.1 (*) 09.6 - 04.5 %   MCV 73.7 (*) 78.0 - 100.0 fL   MCH 24.4 (*) 26.0 - 34.0 pg   MCHC 33.0  30.0 - 36.0 g/dL   RDW 40.9 (*) 81.1 - 91.4 %   Platelets 175  150 - 400 K/uL   Neutrophils Relative 55  43 - 77 %   Neutro Abs 2.9  1.7 - 7.7 K/uL   Lymphocytes Relative 32  12 - 46 %   Lymphs Abs 1.7  0.7 - 4.0 K/uL   Monocytes Relative 10  3 - 12 %   Monocytes Absolute 0.5  0.1 - 1.0 K/uL   Eosinophils Relative 3  0 - 5 %   Eosinophils Absolute 0.1  0.0 - 0.7 K/uL   Basophils Relative 1  0 - 1 %   Basophils Absolute 0.0  0.0 - 0.1 K/uL  COMPREHENSIVE METABOLIC PANEL      Component Value Range   Sodium 140  135 - 145 mEq/L   Potassium 4.2  3.5 - 5.1 mEq/L   Chloride 103  96 - 112 mEq/L   CO2 28  19 - 32 mEq/L   Glucose, Bld 121 (*) 70 - 99 mg/dL   BUN 11  6 - 23 mg/dL   Creatinine, Ser 7.82  0.50 - 1.10 mg/dL   Calcium 9.9  8.4 - 95.6 mg/dL   Total Protein 7.5  6.0 - 8.3 g/dL   Albumin 3.8  3.5 - 5.2 g/dL   AST 21  0 - 37 U/L   ALT 29  0 - 35 U/L   Alkaline Phosphatase 92  39 - 117 U/L   Total Bilirubin 0.2 (*) 0.3 - 1.2 mg/dL   GFR calc non Af Amer 78 (*) >90 mL/min   GFR calc Af Amer >90  >90 mL/min  LIPASE, BLOOD      Component Value Range   Lipase 31  11 - 59 U/L  TROPONIN I      Component Value Range   Troponin I <0.30  <0.30 ng/mL    Date: 02/05/2012  Rate: 74  Rhythm: normal sinus rhythm  QRS Axis: normal  Intervals:  normal  ST/T Wave abnormalities: nonspecific T wave changes  Conduction Disutrbances:none  Narrative Interpretation:   Old EKG Reviewed: changes noted New T-wave inversions and flattening out laterally.    1. Chest pain       MDM  The patient the with the unexplained chest pain that started shortly before arrival to the emergency department approximately 2 and half hours prior to the time that I saw her did resolve with aspirin and nitroglycerin took 2 of him rest at patient's workup is negative no evidence of pulmonary embolism first cardiac marker was negative however patient with some risk factors history of hypertension no family history of premature coronary artery disease recommended admission patient refuses she'll sign out AMA.  The plan was for admission for chest pain have formal rule out of potential cardiac etiology.        Shelda Jakes, MD 02/05/12 [phone]

## 2012-02-06 ENCOUNTER — Other Ambulatory Visit: Payer: 59

## 2012-04-13 ENCOUNTER — Other Ambulatory Visit: Payer: Self-pay | Admitting: Obstetrics and Gynecology

## 2012-04-13 ENCOUNTER — Other Ambulatory Visit (HOSPITAL_COMMUNITY)
Admission: RE | Admit: 2012-04-13 | Discharge: 2012-04-13 | Disposition: A | Discharge status: Home / Self Care | Payer: 59 | Source: Ambulatory Visit | Attending: Obstetrics and Gynecology | Admitting: Obstetrics and Gynecology

## 2012-04-13 DIAGNOSIS — Z124 Encounter for screening for malignant neoplasm of cervix: Secondary | ICD-10-CM | POA: Insufficient documentation

## 2013-05-08 ENCOUNTER — Emergency Department (HOSPITAL_BASED_OUTPATIENT_CLINIC_OR_DEPARTMENT_OTHER)
Admission: EM | Admit: 2013-05-08 | Discharge: 2013-05-08 | Disposition: A | Discharge status: Home / Self Care | Payer: 59 | Attending: Emergency Medicine | Admitting: Emergency Medicine

## 2013-05-08 ENCOUNTER — Emergency Department (HOSPITAL_BASED_OUTPATIENT_CLINIC_OR_DEPARTMENT_OTHER): Payer: 59

## 2013-05-08 ENCOUNTER — Encounter (HOSPITAL_BASED_OUTPATIENT_CLINIC_OR_DEPARTMENT_OTHER): Payer: Self-pay | Admitting: Emergency Medicine

## 2013-05-08 DIAGNOSIS — Z79899 Other long term (current) drug therapy: Secondary | ICD-10-CM | POA: Insufficient documentation

## 2013-05-08 DIAGNOSIS — Z8639 Personal history of other endocrine, nutritional and metabolic disease: Secondary | ICD-10-CM | POA: Insufficient documentation

## 2013-05-08 DIAGNOSIS — I498 Other specified cardiac arrhythmias: Secondary | ICD-10-CM | POA: Insufficient documentation

## 2013-05-08 DIAGNOSIS — Z8709 Personal history of other diseases of the respiratory system: Secondary | ICD-10-CM | POA: Insufficient documentation

## 2013-05-08 DIAGNOSIS — Z8669 Personal history of other diseases of the nervous system and sense organs: Secondary | ICD-10-CM | POA: Insufficient documentation

## 2013-05-08 DIAGNOSIS — I10 Essential (primary) hypertension: Secondary | ICD-10-CM | POA: Insufficient documentation

## 2013-05-08 DIAGNOSIS — Z880 Allergy status to penicillin: Secondary | ICD-10-CM | POA: Insufficient documentation

## 2013-05-08 DIAGNOSIS — I471 Supraventricular tachycardia: Secondary | ICD-10-CM | POA: Diagnosis present

## 2013-05-08 DIAGNOSIS — R002 Palpitations: Secondary | ICD-10-CM | POA: Insufficient documentation

## 2013-05-08 DIAGNOSIS — R42 Dizziness and giddiness: Secondary | ICD-10-CM | POA: Insufficient documentation

## 2013-05-08 DIAGNOSIS — Z862 Personal history of diseases of the blood and blood-forming organs and certain disorders involving the immune mechanism: Secondary | ICD-10-CM | POA: Insufficient documentation

## 2013-05-08 LAB — TROPONIN I: Troponin I: <0.3 ng/mL (ref <0.30)

## 2013-05-08 LAB — CBC
MCH: 23.4 pg — ABNORMAL LOW (ref 26.0–34.0)
MCHC: 31.1 g/dL (ref 30.0–36.0)
Platelets: 270 10*3/uL (ref 150–400)
RDW: 18.7 % — ABNORMAL HIGH (ref 11.5–15.5)

## 2013-05-08 LAB — BASIC METABOLIC PANEL
Calcium: 10.2 mg/dL (ref 8.4–10.5)
GFR calc Af Amer: 71 mL/min — ABNORMAL LOW (ref >90)
GFR calc non Af Amer: 61 mL/min — ABNORMAL LOW (ref >90)
Potassium: 4.3 mEq/L (ref 3.5–5.1)
Sodium: 142 mEq/L (ref 135–145)

## 2013-05-08 MED ORDER — ADENOSINE 6 MG/2ML IV SOLN
6.0000 mg | Freq: Once | INTRAVENOUS | Status: AC
  Administered 2013-05-08: 6 mg via INTRAVENOUS

## 2013-05-08 MED ORDER — SODIUM CHLORIDE 0.9 % IV BOLUS (SEPSIS)
1000.0000 mL | Freq: Once | INTRAVENOUS | Status: AC
  Administered 2013-05-08: 1000 mL via INTRAVENOUS

## 2013-05-08 MED ORDER — METOPROLOL TARTRATE 1 MG/ML IV SOLN
5.0000 mg | Freq: Once | INTRAVENOUS | Status: AC
  Administered 2013-05-08: 5 mg via INTRAVENOUS
  Filled 2013-05-08: qty 5

## 2013-05-08 MED ORDER — ADENOSINE 6 MG/2ML IV SOLN
INTRAVENOUS | Status: AC
  Administered 2013-05-08: 6 mg via INTRAVENOUS
  Filled 2013-05-08: qty 6

## 2013-05-08 NOTE — ED Notes (Signed)
MD at bedside. 

## 2013-05-08 NOTE — ED Notes (Signed)
Discharge instructions given.  Pt awaiting her husband for pick up.

## 2013-05-08 NOTE — ED Provider Notes (Signed)
CSN: [id number]     Arrival date & time 05/08/13  4696 History   First MD Initiated Contact with Patient 05/08/13 [phone]     Chief Complaint  Patient presents with  . Tachycardia   (Consider location/radiation/quality/duration/timing/severity/associated sxs/prior Treatment) Patient is a 42 y.o. female presenting with palpitations. The history is provided by the patient.  Palpitations Palpitations quality:  Regular Onset quality:  Sudden Duration:  2 hours Timing:  Constant Progression:  Unchanged Chronicity:  Recurrent Context comment:  Upon awakening Relieved by:  Nothing Worsened by:  Nothing tried Ineffective treatments:  Valsalva and breathing exercises Associated symptoms: dizziness   Associated symptoms: no back pain, no chest pain, no cough, no nausea, no shortness of breath and no vomiting     Past Medical History  Diagnosis Date  . HTN (hypertension)   . Blind left eye   . Sinusitis   . Hyperlipidemia   . SVT (supraventricular tachycardia)    Past Surgical History  Procedure Laterality Date  . Breast reduction surgery    . Cataract extraction     Family History  Problem Relation Age of Onset  . Stroke Father   . Breast cancer Maternal Grandmother    History  Substance Use Topics  . Smoking status: Never Smoker   . Smokeless tobacco: Not on file  . Alcohol Use: No   OB History   Grav Para Term Preterm Abortions TAB SAB Ect Mult Living                 Review of Systems  Constitutional: Negative for fever and fatigue.  HENT: Negative for congestion and drooling.   Eyes: Negative for pain.  Respiratory: Negative for cough and shortness of breath.   Cardiovascular: Positive for palpitations. Negative for chest pain.  Gastrointestinal: Negative for nausea, vomiting, abdominal pain and diarrhea.  Genitourinary: Negative for dysuria and hematuria.  Musculoskeletal: Negative for back pain, gait problem and neck pain.  Skin: Negative for color change.   Neurological: Positive for dizziness. Negative for headaches.  Hematological: Negative for adenopathy.  Psychiatric/Behavioral: Negative for behavioral problems.  All other systems reviewed and are negative.    Allergies  Amoxicillin  Home Medications   Current Outpatient Rx  Name  Route  Sig  Dispense  Refill  . carisoprodol (SOMA) 350 MG tablet   Oral   Take 350 mg by mouth 3 (three) times daily as needed. For muscle spasms         . cyclobenzaprine (FLEXERIL) 10 MG tablet   Oral   Take 10 mg by mouth 3 (three) times daily as needed. For muscle spasms         . diltiazem (DILACOR XR) 240 MG 24 hr capsule   Oral   Take 240 mg by mouth daily.         . Multiple Vitamins-Minerals (MULTIVITAMINS THER. W/MINERALS) TABS   Oral   Take 1 tablet by mouth daily.          . nebivolol (BYSTOLIC) 10 MG tablet   Oral   Take 10 mg by mouth daily.          Marland Kitchen EXPIRED: omeprazole (PRILOSEC) 20 MG capsule   Oral   Take 1 capsule (20 mg total) by mouth daily.   60 capsule   1   . tobramycin-dexamethasone (TOBRADEX) ophthalmic solution   Left Eye   Place 1 drop into the left eye 2 (two) times daily as needed. For eye pain  BP 169/139  Pulse 193  Resp 15  Ht 5\' 5"  (1.651 m)  Wt 260 lb (117.935 kg)  BMI 43.27 kg/m2  SpO2 100% Physical Exam  Nursing note and vitals reviewed. Constitutional: She is oriented to person, place, and time. She appears well-developed and well-nourished.  HENT:  Head: Normocephalic.  Mouth/Throat: Oropharynx is clear and moist. No oropharyngeal exudate.  Eyes: Conjunctivae and EOM are normal. Pupils are equal, round, and reactive to light.  Neck: Normal range of motion. Neck supple.  Cardiovascular: Normal heart sounds and intact distal pulses.  Exam reveals no gallop and no friction rub.   No murmur heard. SVT, HR 193  Pulmonary/Chest: Effort normal and breath sounds normal. No respiratory distress. She has no wheezes.   Abdominal: Soft. Bowel sounds are normal. There is no tenderness. There is no rebound and no guarding.  Musculoskeletal: Normal range of motion. She exhibits no edema and no tenderness.  Neurological: She is alert and oriented to person, place, and time.  Skin: Skin is warm and dry.  Psychiatric: She has a normal mood and affect. Her behavior is normal.    ED Course  Procedures (including critical care time) Labs Review Labs Reviewed  CBC - Abnormal; Notable for the following:    RBC 5.68 (*)    MCV 75.2 (*)    MCH 23.4 (*)    RDW 18.7 (*)    All other components within normal limits  BASIC METABOLIC PANEL - Abnormal; Notable for the following:    Glucose, Bld 126 (*)    GFR calc non Af Amer 61 (*)    GFR calc Af Amer 71 (*)    All other components within normal limits  TROPONIN I   Imaging Review Dg Chest Port 1 View  05/08/2013   CLINICAL DATA:  Tachycardia  EXAM: PORTABLE CHEST - 1 VIEW  COMPARISON:  02/04/2012  FINDINGS: The heart size and mediastinal contours are within normal limits. Both lungs are clear. The visualized skeletal structures are unremarkable.  IMPRESSION: No active disease.   Electronically Signed   By: Ruel Favors M.D.   On: 05/08/2013 08:14    EKG Interpretation     Ventricular Rate:  88 PR Interval:  196 QRS Duration: 82 QT Interval:  406 QTC Calculation: 491 R Axis:   7 Text Interpretation:  Normal sinus rhythm Nonspecific T wave abnormality Prolonged QT             CRITICAL CARE Performed by: Purvis Sheffield, S Total critical care time: 30 min Critical care time was exclusive of separately billable procedures and treating other patients. Critical care was necessary to treat or prevent imminent or life-threatening deterioration. Critical care was time spent personally by me on the following activities: development of treatment plan with patient and/or surrogate as well as nursing, discussions with consultants, evaluation of  patient's response to treatment, examination of patient, obtaining history from patient or surrogate, ordering and performing treatments and interventions, ordering and review of laboratory studies, ordering and review of radiographic studies, pulse oximetry and re-evaluation of patient's condition.  MDM   1. SVT (supraventricular tachycardia)    7:18 AM 42 y.o. female who presents with palpitations which began at approximately 5 AM this morning when she got up to go to the bathroom. The patient has a history of SVT and her HR is in the 190's on arrival. She notes mild dizziness and sob. Denies cp. Pt notes hx of SVT multiple times in the past.  Last time relieved w/ dunking her head in ice water.  We attempted several vagal maneuvers included dunking the pt's face in ice water w/out relief. Pt was given 6mg  adenosine w/ conversion.   Pt did take her diltiazem this morning, has not taken her bystolic. Will give 5mg  metoprolol IV. HR is currently 114.   8:34 AM: Pt continues to appear well. Labs non-contrib.  I have discussed the diagnosis/risks/treatment options with the patient and believe the pt to be eligible for discharge home to follow-up with her cardiologist as needed. We also discussed returning to the ED immediately if new or worsening sx occur. We discussed the sx which are most concerning (e.g., further palpitations, sob, cp) that necessitate immediate return. Any new prescriptions provided to the patient are listed below.  New Prescriptions   No medications on file     Critical care documented in this patient who presented w/ SVT (HR 194) requiring focused medical decision making, repeat evaluations, and adenosine to chemically cardiovert.     Junius Argyle, MD 05/08/13 [phone]

## 2013-05-08 NOTE — ED Notes (Signed)
Pt presents to ED c/o "SVT attack" - states it began earlier this morning and that she has history of SVT.

## 2013-05-08 NOTE — ED Notes (Signed)
MD at bedside, O2 at 2L per [place].  MD performing vagal maneuvers.

## 2014-01-18 ENCOUNTER — Encounter: Payer: Self-pay | Admitting: *Deleted

## 2014-01-30 ENCOUNTER — Emergency Department (HOSPITAL_BASED_OUTPATIENT_CLINIC_OR_DEPARTMENT_OTHER)
Admission: EM | Admit: 2014-01-30 | Discharge: 2014-01-30 | Disposition: A | Discharge status: Home / Self Care | Payer: 59 | Attending: Emergency Medicine | Admitting: Emergency Medicine

## 2014-01-30 ENCOUNTER — Emergency Department (HOSPITAL_BASED_OUTPATIENT_CLINIC_OR_DEPARTMENT_OTHER): Payer: 59

## 2014-01-30 ENCOUNTER — Encounter (HOSPITAL_BASED_OUTPATIENT_CLINIC_OR_DEPARTMENT_OTHER): Payer: Self-pay | Admitting: Emergency Medicine

## 2014-01-30 DIAGNOSIS — I471 Supraventricular tachycardia, unspecified: Secondary | ICD-10-CM | POA: Insufficient documentation

## 2014-01-30 DIAGNOSIS — Z8709 Personal history of other diseases of the respiratory system: Secondary | ICD-10-CM | POA: Insufficient documentation

## 2014-01-30 DIAGNOSIS — I10 Essential (primary) hypertension: Secondary | ICD-10-CM | POA: Insufficient documentation

## 2014-01-30 DIAGNOSIS — E669 Obesity, unspecified: Secondary | ICD-10-CM | POA: Diagnosis not present

## 2014-01-30 DIAGNOSIS — Z880 Allergy status to penicillin: Secondary | ICD-10-CM | POA: Diagnosis not present

## 2014-01-30 DIAGNOSIS — H5440 Blindness, one eye, unspecified eye: Secondary | ICD-10-CM | POA: Diagnosis not present

## 2014-01-30 DIAGNOSIS — Z79899 Other long term (current) drug therapy: Secondary | ICD-10-CM | POA: Diagnosis not present

## 2014-01-30 DIAGNOSIS — G8929 Other chronic pain: Secondary | ICD-10-CM | POA: Diagnosis not present

## 2014-01-30 DIAGNOSIS — R002 Palpitations: Secondary | ICD-10-CM | POA: Diagnosis present

## 2014-01-30 LAB — CBC WITH DIFFERENTIAL/PLATELET
Basophils Absolute: 0 10*3/uL (ref 0.0–0.1)
Basophils Relative: 1 % (ref 0–1)
EOS ABS: 0.2 10*3/uL (ref 0.0–0.7)
EOS PCT: 3 % (ref 0–5)
HCT: 37.5 % (ref 36.0–46.0)
Hemoglobin: 11.9 g/dL — ABNORMAL LOW (ref 12.0–15.0)
LYMPHS ABS: 1.7 10*3/uL (ref 0.7–4.0)
LYMPHS PCT: 31 % (ref 12–46)
MCH: 23.6 pg — ABNORMAL LOW (ref 26.0–34.0)
MCHC: 31.7 g/dL (ref 30.0–36.0)
MCV: 74.3 fL — AB (ref 78.0–100.0)
MONO ABS: 0.6 10*3/uL (ref 0.1–1.0)
Monocytes Relative: 11 % (ref 3–12)
Neutro Abs: 3.1 10*3/uL (ref 1.7–7.7)
Neutrophils Relative %: 55 % (ref 43–77)
Platelets: 174 10*3/uL (ref 150–400)
RBC: 5.05 MIL/uL (ref 3.87–5.11)
RDW: 17.5 % — ABNORMAL HIGH (ref 11.5–15.5)
WBC: 5.6 10*3/uL (ref 4.0–10.5)

## 2014-01-30 LAB — COMPREHENSIVE METABOLIC PANEL
ALT: 19 U/L (ref 0–35)
ANION GAP: 11 (ref 5–15)
AST: 21 U/L (ref 0–37)
Albumin: 3.9 g/dL (ref 3.5–5.2)
Alkaline Phosphatase: 89 U/L (ref 39–117)
BUN: 16 mg/dL (ref 6–23)
CALCIUM: 10.3 mg/dL (ref 8.4–10.5)
CHLORIDE: 105 meq/L (ref 96–112)
CO2: 29 meq/L (ref 19–32)
CREATININE: 1.1 mg/dL (ref 0.50–1.10)
GFR, EST AFRICAN AMERICAN: 70 mL/min — AB (ref >90)
GFR, EST NON AFRICAN AMERICAN: 61 mL/min — AB (ref >90)
GLUCOSE: 98 mg/dL (ref 70–99)
Potassium: 4.7 mEq/L (ref 3.7–5.3)
Sodium: 145 mEq/L (ref 137–147)
Total Protein: 7.8 g/dL (ref 6.0–8.3)

## 2014-01-30 LAB — TROPONIN I: Troponin I: <0.3 ng/mL (ref <0.30)

## 2014-01-30 MED ORDER — NEBIVOLOL HCL 10 MG PO TABS: 10.0000 mg | ORAL_TABLET | Freq: Every day | ORAL | Status: AC

## 2014-01-30 MED ORDER — DILTIAZEM HCL ER 240 MG PO CP24: 240.0000 mg | ORAL_CAPSULE | Freq: Every day | ORAL | Status: AC

## 2014-01-30 NOTE — Discharge Instructions (Signed)

## 2014-01-30 NOTE — ED Provider Notes (Signed)
Medical screening examination/treatment/procedure(s) were performed by non-physician practitioner and as supervising physician I was immediately available for consultation/collaboration.   EKG Interpretation   Date/Time:  Monday January 30 2014 17:08:25 EDT Ventricular Rate:  89 PR Interval:  172 QRS Duration: 80 QT Interval:  400 QTC Calculation: 486 R Axis:   -2 Text Interpretation:  Normal sinus rhythm Left ventricular hypertrophy  Nonspecific T wave abnormality Prolonged QT Abnormal ECG No significant  change was found Confirmed by [facility]  MD, TREY ([id number]) on 01/30/2014  5:43:05 PM        Jonny RuizJohn Young Berryavid Xion Debruyne III, MD 01/30/14 [phone]

## 2014-01-30 NOTE — ED Notes (Signed)
PA at bedside.

## 2014-01-30 NOTE — ED Notes (Addendum)
Py c/o palpitations x 6 hrs. Pt states she has tried face in cold water, bearing down and coughing hard with out relief   HX SVT

## 2014-01-30 NOTE — ED Provider Notes (Signed)
CSN: [id number]     Arrival date & time 01/30/14  1650 History   First MD Initiated Contact with Patient 01/30/14 1727     Chief Complaint  Patient presents with  . Palpitations     (Consider location/radiation/quality/duration/timing/severity/associated sxs/prior Treatment) Patient is a 43 y.o. female presenting with palpitations. The history is provided by the patient. No language interpreter was used.  Palpitations Palpitations quality:  Fast Onset quality:  Sudden Timing:  Constant Progression:  Resolved Chronicity:  Recurrent Context: not anxiety and not caffeine   Relieved by:  Nothing Worsened by:  Nothing tried Ineffective treatments:  None tried Associated symptoms: no chest pain, no chest pressure and no shortness of breath   Risk factors: no diabetes mellitus and no hx of PE    Pt is suppose to be taking bystolic and diltiazem but ran out of medications.  No current MD.   Pt has had SVT in the past requiring cardioversion.   Pt felt like she was in svt.  Pt ran cold water over her head before coming in.  Pt reports symptoms improved/resolved before getting here. Past Medical History  Diagnosis Date  . HTN (hypertension)   . Blind left eye   . Sinusitis   . Hyperlipidemia   . SVT (supraventricular tachycardia)   . Obesity   . Chronic headaches   . Sleep apnea    Past Surgical History  Procedure Laterality Date  . Breast reduction surgery    . Cataract extraction     Family History  Problem Relation Age of Onset  . Stroke Father   . Lupus Father   . Breast cancer Maternal Grandmother   . Hypertension Mother   . Asthma Sister    History  Substance Use Topics  . Smoking status: Never Smoker   . Smokeless tobacco: Not on file  . Alcohol Use: No   OB History   Grav Para Term Preterm Abortions TAB SAB Ect Mult Living                 Review of Systems  Respiratory: Negative for shortness of breath.   Cardiovascular: Positive for palpitations. Negative  for chest pain.  All other systems reviewed and are negative.     Allergies  Amoxicillin  Home Medications   Prior to Admission medications   Medication Sig Start Date End Date Taking? Authorizing Provider  carisoprodol (SOMA) 350 MG tablet Take 350 mg by mouth 3 (three) times daily as needed. For muscle spasms    Historical Provider, MD  cyclobenzaprine (FLEXERIL) 10 MG tablet Take 10 mg by mouth 3 (three) times daily as needed. For muscle spasms    Historical Provider, MD  diltiazem (DILACOR XR) 240 MG 24 hr capsule Take 240 mg by mouth daily.    Historical Provider, MD  Multiple Vitamins-Minerals (MULTIVITAMINS THER. W/MINERALS) TABS Take 1 tablet by mouth daily.     Historical Provider, MD  nebivolol (BYSTOLIC) 10 MG tablet Take 10 mg by mouth daily.     Historical Provider, MD  omeprazole (PRILOSEC) 20 MG capsule Take 1 capsule (20 mg total) by mouth daily. 01/24/11 01/24/12  Oretha Milchakesh Alva V, MD  tobramycin-dexamethasone [facility](TOBRADEX) ophthalmic solution Place 1 drop into the left eye 2 (two) times daily as needed. For eye pain    Historical Provider, MD   BP 174/114  Pulse 89  Temp(Src) 98.2 F (36.8 C) (Oral)  Resp 69  Wt 260 lb (117.935 kg)  SpO2 100% Physical Exam  Nursing note and vitals reviewed. Constitutional: She is oriented to person, place, and time. She appears well-developed and well-nourished.  HENT:  Head: Normocephalic.  Right Ear: External ear normal.  Left Ear: External ear normal.  Nose: Nose normal.  Mouth/Throat: Oropharynx is clear and moist.  Eyes: EOM are normal. Pupils are equal, round, and reactive to light.  Neck: Normal range of motion.  Cardiovascular: Normal rate, regular rhythm and normal heart sounds.   Pulmonary/Chest: Effort normal and breath sounds normal.  Abdominal: Soft. She exhibits no distension.  Musculoskeletal: Normal range of motion.  Neurological: She is alert and oriented to person, place, and time.  Psychiatric: She has a normal  mood and affect.    ED Course  Procedures (including critical care time) Labs Review Labs Reviewed  CBC WITH DIFFERENTIAL  TROPONIN I  COMPREHENSIVE METABOLIC PANEL    Imaging Review No results found.   EKG Interpretation   Date/Time:  Monday January 30 2014 17:08:25 EDT Ventricular Rate:  89 PR Interval:  172 QRS Duration: 80 QT Interval:  400 QTC Calculation: 486 R Axis:   -2 Text Interpretation:  Normal sinus rhythm Left ventricular hypertrophy  Nonspecific T wave abnormality Prolonged QT Abnormal ECG No significant  change was found Confirmed by [facility]  MD, TREY ([id number]) on 01/30/2014  5:43:05 PM      MDM   Final diagnoses:  Palpitation    Pt monitored,   No svt.   Pt remains in normal sinus,   Chest xray normal.  Normal troponin,  No chest pain.   Pt given rx for medications.   Resource guide to help find MD.   Pt advised to return if any problems.     Lonia Skinner [place], PA-C 01/30/14 2337

## 2014-05-05 ENCOUNTER — Other Ambulatory Visit: Payer: Self-pay | Admitting: Occupational Medicine

## 2014-05-05 ENCOUNTER — Ambulatory Visit: Payer: Self-pay

## 2014-05-05 DIAGNOSIS — R7612 Nonspecific reaction to cell mediated immunity measurement of gamma interferon antigen response without active tuberculosis: Secondary | ICD-10-CM

## 2014-09-06 ENCOUNTER — Other Ambulatory Visit: Payer: Self-pay | Admitting: Dermatology

## 2014-09-11 ENCOUNTER — Ambulatory Visit: Payer: Self-pay | Admitting: Interventional Cardiology

## 2014-09-20 ENCOUNTER — Encounter: Payer: Self-pay | Admitting: Interventional Cardiology

## 2015-02-27 ENCOUNTER — Other Ambulatory Visit (HOSPITAL_COMMUNITY): Payer: Self-pay | Admitting: Family Medicine

## 2015-02-27 DIAGNOSIS — Z1231 Encounter for screening mammogram for malignant neoplasm of breast: Secondary | ICD-10-CM

## 2015-02-28 ENCOUNTER — Ambulatory Visit (HOSPITAL_COMMUNITY)
Admission: RE | Admit: 2015-02-28 | Discharge: 2015-02-28 | Disposition: A | Discharge status: Home / Self Care | Payer: Self-pay | Source: Ambulatory Visit | Attending: Family Medicine | Admitting: Family Medicine

## 2015-02-28 DIAGNOSIS — Z1231 Encounter for screening mammogram for malignant neoplasm of breast: Secondary | ICD-10-CM

## 2015-03-03 ENCOUNTER — Emergency Department (HOSPITAL_BASED_OUTPATIENT_CLINIC_OR_DEPARTMENT_OTHER)
Admission: EM | Admit: 2015-03-03 | Discharge: 2015-03-03 | Disposition: A | Discharge status: Home / Self Care | Payer: Self-pay | Attending: Emergency Medicine | Admitting: Emergency Medicine

## 2015-03-03 ENCOUNTER — Emergency Department (HOSPITAL_BASED_OUTPATIENT_CLINIC_OR_DEPARTMENT_OTHER): Payer: Self-pay

## 2015-03-03 ENCOUNTER — Encounter (HOSPITAL_BASED_OUTPATIENT_CLINIC_OR_DEPARTMENT_OTHER): Payer: Self-pay | Admitting: Emergency Medicine

## 2015-03-03 DIAGNOSIS — E669 Obesity, unspecified: Secondary | ICD-10-CM | POA: Insufficient documentation

## 2015-03-03 DIAGNOSIS — R531 Weakness: Secondary | ICD-10-CM | POA: Insufficient documentation

## 2015-03-03 DIAGNOSIS — N390 Urinary tract infection, site not specified: Secondary | ICD-10-CM | POA: Insufficient documentation

## 2015-03-03 DIAGNOSIS — I10 Essential (primary) hypertension: Secondary | ICD-10-CM | POA: Insufficient documentation

## 2015-03-03 DIAGNOSIS — R55 Syncope and collapse: Secondary | ICD-10-CM | POA: Insufficient documentation

## 2015-03-03 DIAGNOSIS — R42 Dizziness and giddiness: Secondary | ICD-10-CM | POA: Insufficient documentation

## 2015-03-03 DIAGNOSIS — Z3202 Encounter for pregnancy test, result negative: Secondary | ICD-10-CM | POA: Insufficient documentation

## 2015-03-03 DIAGNOSIS — Z79899 Other long term (current) drug therapy: Secondary | ICD-10-CM | POA: Insufficient documentation

## 2015-03-03 DIAGNOSIS — R200 Anesthesia of skin: Secondary | ICD-10-CM | POA: Insufficient documentation

## 2015-03-03 DIAGNOSIS — R079 Chest pain, unspecified: Secondary | ICD-10-CM | POA: Insufficient documentation

## 2015-03-03 DIAGNOSIS — G8929 Other chronic pain: Secondary | ICD-10-CM | POA: Insufficient documentation

## 2015-03-03 DIAGNOSIS — Z880 Allergy status to penicillin: Secondary | ICD-10-CM | POA: Insufficient documentation

## 2015-03-03 DIAGNOSIS — Z8709 Personal history of other diseases of the respiratory system: Secondary | ICD-10-CM | POA: Insufficient documentation

## 2015-03-03 DIAGNOSIS — R0602 Shortness of breath: Secondary | ICD-10-CM | POA: Insufficient documentation

## 2015-03-03 DIAGNOSIS — H5442 Blindness, left eye, normal vision right eye: Secondary | ICD-10-CM | POA: Insufficient documentation

## 2015-03-03 LAB — URINALYSIS, ROUTINE W REFLEX MICROSCOPIC
Bilirubin Urine: NEGATIVE
GLUCOSE, UA: NEGATIVE mg/dL
HGB URINE DIPSTICK: NEGATIVE
Ketones, ur: NEGATIVE mg/dL
Nitrite: NEGATIVE
PH: 5.5 (ref 5.0–8.0)
Protein, ur: NEGATIVE mg/dL
SPECIFIC GRAVITY, URINE: 1.025 (ref 1.005–1.030)
Urobilinogen, UA: 1 mg/dL (ref 0.0–1.0)

## 2015-03-03 LAB — CBC WITH DIFFERENTIAL/PLATELET
BASOS PCT: 0 % (ref 0–1)
Basophils Absolute: 0 10*3/uL (ref 0.0–0.1)
EOS PCT: 2 % (ref 0–5)
Eosinophils Absolute: 0.1 10*3/uL (ref 0.0–0.7)
HEMATOCRIT: 39.1 % (ref 36.0–46.0)
Hemoglobin: 12.1 g/dL (ref 12.0–15.0)
Lymphocytes Relative: 28 % (ref 12–46)
Lymphs Abs: 1.3 10*3/uL (ref 0.7–4.0)
MCH: 23.2 pg — ABNORMAL LOW (ref 26.0–34.0)
MCHC: 30.9 g/dL (ref 30.0–36.0)
MCV: 75 fL — ABNORMAL LOW (ref 78.0–100.0)
MONO ABS: 0.3 10*3/uL (ref 0.1–1.0)
MONOS PCT: 6 % (ref 3–12)
NEUTROS ABS: 3 10*3/uL (ref 1.7–7.7)
Neutrophils Relative %: 64 % (ref 43–77)
PLATELETS: 212 10*3/uL (ref 150–400)
RBC: 5.21 MIL/uL — ABNORMAL HIGH (ref 3.87–5.11)
RDW: 17.6 % — AB (ref 11.5–15.5)
WBC: 4.8 10*3/uL (ref 4.0–10.5)

## 2015-03-03 LAB — BASIC METABOLIC PANEL
Anion gap: 8 (ref 5–15)
BUN: 14 mg/dL (ref 6–20)
CHLORIDE: 107 mmol/L (ref 101–111)
CO2: 26 mmol/L (ref 22–32)
CREATININE: 1.27 mg/dL — AB (ref 0.44–1.00)
Calcium: 9.6 mg/dL (ref 8.9–10.3)
GFR calc Af Amer: 59 mL/min — ABNORMAL LOW (ref >60)
GFR calc non Af Amer: 51 mL/min — ABNORMAL LOW (ref >60)
GLUCOSE: 179 mg/dL — AB (ref 65–99)
Potassium: 3.8 mmol/L (ref 3.5–5.1)
Sodium: 141 mmol/L (ref 135–145)

## 2015-03-03 LAB — TROPONIN I: Troponin I: <0.03 ng/mL (ref <0.031)

## 2015-03-03 LAB — URINE MICROSCOPIC-ADD ON

## 2015-03-03 LAB — CK: Total CK: 163 U/L (ref 38–234)

## 2015-03-03 LAB — PREGNANCY, URINE: PREG TEST UR: NEGATIVE

## 2015-03-03 LAB — D-DIMER, QUANTITATIVE (NOT AT ARMC)

## 2015-03-03 MED ORDER — SODIUM CHLORIDE 0.9 % IV BOLUS (SEPSIS)
1000.0000 mL | Freq: Once | INTRAVENOUS | Status: AC
  Administered 2015-03-03: 1000 mL via INTRAVENOUS

## 2015-03-03 MED ORDER — CEFTRIAXONE SODIUM 1 G IJ SOLR
INTRAMUSCULAR | Status: DC
Start: 2015-03-03 — End: 2015-03-04
  Filled 2015-03-03: qty 10

## 2015-03-03 MED ORDER — CEFTRIAXONE SODIUM 1 G IJ SOLR
1.0000 g | Freq: Once | INTRAMUSCULAR | Status: AC
  Administered 2015-03-03: 1 g via INTRAVENOUS

## 2015-03-03 MED ORDER — CEPHALEXIN 500 MG PO CAPS: 500.0000 mg | ORAL_CAPSULE | Freq: Four times a day (QID) | ORAL | Status: AC

## 2015-03-03 NOTE — ED Notes (Addendum)
Dr. Manus Gunning at [facility] explaining results, dx and d/c plan. Given crackers and soda per request.

## 2015-03-03 NOTE — ED Notes (Signed)
Up to b/r, steady gait, alert, NAD, calm, interactive, "ready to go home". Denies questions or needs.

## 2015-03-03 NOTE — ED Notes (Signed)
Patient is sitting in the waiting room, texting on her phone in no distress

## 2015-03-03 NOTE — ED Notes (Signed)
Patient also reports that her right side is completely numb

## 2015-03-03 NOTE — ED Notes (Signed)
FAST assessment: negative 

## 2015-03-03 NOTE — ED Notes (Signed)
MD at bedside. 

## 2015-03-03 NOTE — ED Provider Notes (Signed)
CSN: [id number]     Arrival date & time 03/03/15  1354 History  This chart was scribed for Glynn Octave, MD by Lyndel Safe, ED Scribe. This patient was seen in room MH01/MH01 and the patient's care was started 5:37 PM.   Chief Complaint  Patient presents with  . Loss of Consciousness   HPI HPI Comments: Evelyn Johnson is a 44 y.o. female, with a PMhx of HTN, SVT, obesity, neck pain, and chest pain, who presents to the Emergency Department complaining of sudden onset room spinning and light-headedness onset 4 hours ago. She endorses possible syncope. Pt reports while at an outdoor event today she became light-headed and tachycardic with generalized weakness and right-sided, LE numbness that lasted 30 minutes. Her light-headedness and weakness lasted 1.5 hours.She did not have any food until the event today around 1pm. The pt reports she was under a tent, in the shade the duration of being outside, which was 5 minutes. She additionally reports associated SOB and blurred vision which has improved since onset. The pt also experienced central, non-radiating chest pain with onset of symptoms that lasted several minutes and has since resolved. She has a history of SVT and she is concerned for a repeating event which prompted her to come for ED evaluation. She notes experiencing palpitations with past SVT but denies palpitations today. Pt reports bilateral breast pain at baseline onset 3 weeks ago and also lumps in breasts which she has been evaluated for. She also states neck spasms at baseline. Pt is legally blind in her left eye. Denies discharge from nipples, back pain, abdominal pain, or palpitations. Pt not followed by PCP.   Past Medical History  Diagnosis Date  . HTN (hypertension)   . Blind left eye   . Sinusitis   . Hyperlipidemia   . SVT (supraventricular tachycardia)   . Obesity   . Chronic headaches   . Sleep apnea   . OSA (obstructive sleep apnea)   . Neck pain   . Chest pain     Past Surgical History  Procedure Laterality Date  . Breast reduction surgery    . Cataract extraction     Family History  Problem Relation Age of Onset  . Stroke Father   . Lupus Father   . Breast cancer Maternal Grandmother   . Hypertension Mother   . Asthma Sister   . Colon cancer Maternal Uncle    Social History  Substance Use Topics  . Smoking status: Never Smoker   . Smokeless tobacco: None  . Alcohol Use: No   OB History    No data available     Review of Systems  Respiratory: Positive for shortness of breath.   Cardiovascular: Positive for chest pain. Negative for palpitations.  Gastrointestinal: Negative for abdominal pain.  Musculoskeletal: Negative for back pain.  Neurological: Positive for dizziness, weakness, light-headedness and numbness.  A complete 10 system review of systems was obtained and is otherwise negative except at noted in the HPI and PMH.  Allergies  Amoxicillin  Home Medications   Prior to Admission medications   Medication Sig Start Date End Date Taking? Authorizing Provider  cephALEXin (KEFLEX) 500 MG capsule Take 1 capsule (500 mg total) by mouth 4 (four) times daily. 03/03/15   Glynn Octave, MD  cyclobenzaprine (FLEXERIL) 10 MG tablet Take 10 mg by mouth 3 (three) times daily as needed for muscle spasms.    Historical Provider, MD  diltiazem (DILACOR XR) 240 MG 24 hr capsule Take  1 capsule (240 mg total) by mouth daily. 01/30/14   Elson Areas, PA-C  Multiple Vitamins-Minerals (MULTIVITAMINS THER. W/MINERALS) TABS Take 1 tablet by mouth daily.     Historical Provider, MD  nebivolol (BYSTOLIC) 10 MG tablet Take 1 tablet (10 mg total) by mouth daily. 01/30/14   Elson Areas, PA-C  omeprazole (PRILOSEC) 20 MG capsule Take 1 capsule (20 mg total) by mouth daily. 01/24/11 01/24/12  Oretha Milch, MD  tobramycin-dexamethasone [facility]) ophthalmic solution Place 1 drop into the left eye 2 (two) times daily as needed. For eye pain    Historical  Provider, MD   BP 152/104 mmHg  Pulse 77  Temp(Src) 97.8 F (36.6 C) (Oral)  Resp 18  Ht 5\' 6"  (1.676 m)  Wt 235 lb (106.595 kg)  BMI 37.95 kg/m2  SpO2 100% Physical Exam  Constitutional: She is oriented to person, place, and time. She appears well-developed and well-nourished. No distress.  HENT:  Head: Normocephalic and atraumatic.  Mouth/Throat: Oropharynx is clear and moist. No oropharyngeal exudate.  Eyes: Conjunctivae and EOM are normal. Pupils are equal, round, and reactive to light.  Opacified left cornea that is chronic.   Neck: Normal range of motion. Neck supple.  No meningismus.  Cardiovascular: Normal rate, regular rhythm, normal heart sounds and intact distal pulses.   No murmur heard. Pulmonary/Chest: Effort normal and breath sounds normal. No respiratory distress. She exhibits tenderness.  Central chest tenderness; cystic breasts that are diffusely tender without discrete lumps or masses.  Chaperone present  Abdominal: Soft. There is no tenderness. There is no rebound and no guarding.  Musculoskeletal: Normal range of motion. She exhibits no edema or tenderness.  Neurological: She is alert and oriented to person, place, and time. No cranial nerve deficit. She exhibits normal muscle tone. Coordination normal.  No ataxia on finger to nose bilaterally. No pronator drift. 5/5 strength throughout. CN 2-12 intact. Negative Romberg. Equal grip strength. Sensation intact. Gait is normal.   Skin: Skin is warm.  Psychiatric: She has a normal mood and affect. Her behavior is normal.  Nursing note and vitals reviewed.   ED Course  Procedures  DIAGNOSTIC STUDIES: Oxygen Saturation is 100% on RA, normal by my interpretation.    COORDINATION OF CARE: 5:49 PM Discussed treatment plan which includes to order cardiac workup, IV fluids and head CT with pt. Pt acknowledges and agrees to plan.   Labs Review Labs Reviewed  BASIC METABOLIC PANEL - Abnormal; Notable for the  following:    Glucose, Bld 179 (*)    Creatinine, Ser 1.27 (*)    GFR calc non Af Amer 51 (*)    GFR calc Af Amer 59 (*)    All other components within normal limits  URINALYSIS, ROUTINE W REFLEX MICROSCOPIC (NOT AT [facility]) - Abnormal; Notable for the following:    APPearance CLOUDY (*)    Leukocytes, UA SMALL (*)    All other components within normal limits  CBC WITH DIFFERENTIAL/PLATELET - Abnormal; Notable for the following:    RBC 5.21 (*)    MCV 75.0 (*)    MCH 23.2 (*)    RDW 17.6 (*)    All other components within normal limits  URINE MICROSCOPIC-ADD ON - Abnormal; Notable for the following:    Squamous Epithelial / LPF FEW (*)    Bacteria, UA MANY (*)    Casts HYALINE CASTS (*)    All other components within normal limits  TROPONIN I  CK  D-DIMER, QUANTITATIVE (NOT AT [facility])  TROPONIN I  PREGNANCY, URINE  CBG MONITORING, ED    Imaging Review Dg Chest 2 View  03/03/2015   CLINICAL DATA:  Chest pain.  EXAM: CHEST  2 VIEW  COMPARISON:  05/05/2014  FINDINGS: Normal heart size and mediastinal contours. No acute infiltrate or edema. No effusion or pneumothorax. No acute osseous findings.  IMPRESSION: No active cardiopulmonary disease.   Electronically Signed   By: Marnee Spring M.D.   On: 03/03/2015 19:49   Ct Head Wo Contrast  03/03/2015   CLINICAL DATA:  Syncopal episode earlier today. History of hypertension.  EXAM: CT HEAD WITHOUT CONTRAST  TECHNIQUE: Contiguous axial images were obtained from the base of the skull through the vertex without intravenous contrast.  COMPARISON:  01/29/2012 MRI brain.  FINDINGS: No evidence for acute infarction, hemorrhage, mass lesion, hydrocephalus, or extra-axial fluid. No atrophy or white matter disease. Intact calvarium. Bifrontal hyperostosis. No acute sinus or mastoid disease. Deformed LEFT globe consistent with the history of blindness.  IMPRESSION: Negative exam.  No change from priors.   Electronically Signed   By: Elsie Stain M.D.    On: 03/03/2015 15:01   I have personally reviewed and evaluated these images and lab results as part of my medical decision-making.   EKG Interpretation   Date/Time:  Saturday March 03 2015 14:20:24 EDT Ventricular Rate:  83 PR Interval:  158 QRS Duration: 92 QT Interval:  394 QTC Calculation: 462 R Axis:   33 Text Interpretation:  Sinus rhythm with frequent Premature ventricular  complexes ST \\T \ T wave abnormality, consider inferolateral ischemia  Prolonged QT Abnormal ECG No significant change since last tracing  Confirmed by LIU MD, DANA ([id number]) on 03/03/2015 2:26:51 PM      MDM   Final diagnoses:  Near syncope  Urinary tract infection without hematuria, site unspecified   episode of lightheadedness, palpitations, possible syncope. States she felt overheated and did not eat breakfast this morning. Began to feel lightheaded and dizzy and may have lost consciousness while sitting at a table. Did not hit head. History of SVT and thought this was similar.  EKG with nonspecific T wave changes laterally unchanged. Patient reports bilateral breast soreness ongoing for several weeks. Had brief episode of central chest pain that is now resolved.  Nonfocal neurological exam. Had right leg only numbness earlier which is now resolved.  EKG is unchanged. Neurological exam is nonfocal. Troponin and d-dimer are negative.  Patient given IV and by mouth fluids. She is tolerant by mouth and ambulatory. We'll treat UTI. Troponin negative 2, EKG unchanged. Advised to follow-up with breast Center as scheduled regarding her breast pain issues.  Suspect vasovagal episode with possible episode of resolved SVT. Return precautions discussed.  I personally performed the services described in this documentation, which was scribed in my presence. The recorded information has been reviewed and is accurate.    Glynn Octave, MD 03/04/15 [phone]

## 2015-03-03 NOTE — Discharge Instructions (Signed)
Near-Syncope Keep yourself hydrated. Follow up with your doctor. Return to ED if you develop new or worsening symptoms. Near-syncope (commonly known as near fainting) is sudden weakness, dizziness, or feeling like you might pass out. During an episode of near-syncope, you may also develop pale skin, have tunnel vision, or feel sick to your stomach (nauseous). Near-syncope may occur when getting up after sitting or while standing for a long time. It is caused by a sudden decrease in blood flow to the brain. This decrease can result from various causes or triggers, most of which are not serious. However, because near-syncope can sometimes be a sign of something serious, a medical evaluation is required. The specific cause is often not determined. HOME CARE INSTRUCTIONS  Monitor your condition for any changes. The following actions may help to alleviate any discomfort you are experiencing:  Have someone stay with you until you feel stable.  Lie down right away and prop your feet up if you start feeling like you might faint. Breathe deeply and steadily. Wait until all the symptoms have passed. Most of these episodes last only a few minutes. You may feel tired for several hours.   Drink enough fluids to keep your urine clear or pale yellow.   If you are taking blood pressure or heart medicine, get up slowly when seated or lying down. Take several minutes to sit and then stand. This can reduce dizziness.  Follow up with your health care provider as directed. SEEK IMMEDIATE MEDICAL CARE IF:   You have a severe headache.   You have unusual pain in the chest, abdomen, or back.   You are bleeding from the mouth or rectum, or you have black or tarry stool.   You have an irregular or very fast heartbeat.   You have repeated fainting or have seizure-like jerking during an episode.   You faint when sitting or lying down.   You have confusion.   You have difficulty walking.   You have  severe weakness.   You have vision problems.  MAKE SURE YOU:   Understand these instructions.  Will watch your condition.  Will get help right away if you are not doing well or get worse. Document Released: 06/09/2005 Document Revised: 06/14/2013 Document Reviewed: 11/12/2012 [facility] Patient Information 2015 [place], [place]. This information is not intended to replace advice given to you by your health care provider. Make sure you discuss any questions you have with your health care provider.

## 2015-03-03 NOTE — ED Notes (Signed)
Patient reports that she became very dizzy and had rapid HR at church and then reports that she passed out. The patient reports that she has a history of SVT - the patient reports that it now is "taking her vision" and has blurred vision. Reports that she also is feeling SOB and hat all over. Reports that before she passed out she felt some twinges of chest pain to her left chest. Reports that she "cant tell if I am having chest pain because I have pain up under my breast and I cant differentiate between the 2"

## 2015-03-05 ENCOUNTER — Other Ambulatory Visit: Payer: Self-pay | Admitting: Family Medicine

## 2015-03-05 ENCOUNTER — Other Ambulatory Visit: Payer: Self-pay | Admitting: Obstetrics and Gynecology

## 2015-03-05 DIAGNOSIS — N6310 Unspecified lump in the right breast, unspecified quadrant: Secondary | ICD-10-CM

## 2015-03-05 DIAGNOSIS — N6320 Unspecified lump in the left breast, unspecified quadrant: Principal | ICD-10-CM

## 2015-11-14 ENCOUNTER — Other Ambulatory Visit: Payer: Self-pay | Admitting: Obstetrics and Gynecology

## 2015-11-15 LAB — CYTOLOGY - PAP

## 2015-11-16 IMAGING — CT CT HEAD W/O CM
1 series · 16 of 30 positions shown, 20 images · non-contrast
Comparison: 01/29/2012 MRI brain.

CLINICAL DATA: Syncopal episode earlier today. History of
hypertension.

EXAM:
CT HEAD WITHOUT CONTRAST
TECHNIQUE: Contiguous axial images were obtained from the base of the skull
through the vertex without intravenous contrast.

[Series 2: head 4.8 h37s · axial · 0.45mm/px · z∈[-167,-31]mm · 16 of 32 slices shown, 20 images]
[im 2/32  brain]
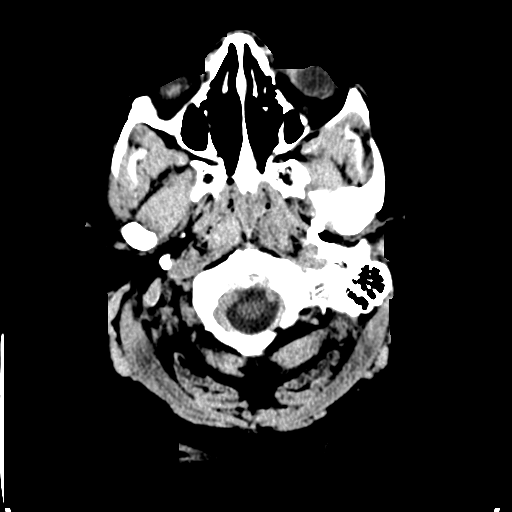
[im 2/32  bone]
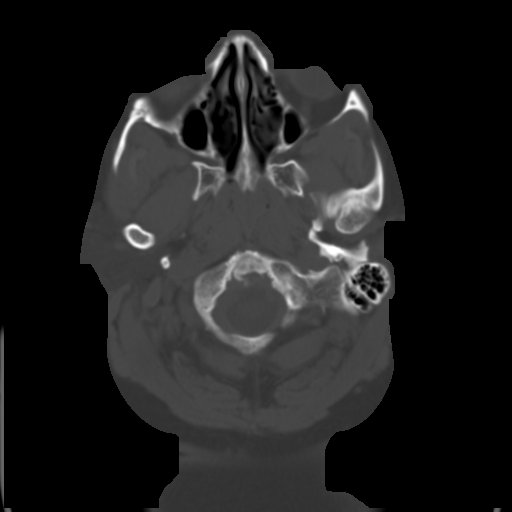
[im 4/32  brain]
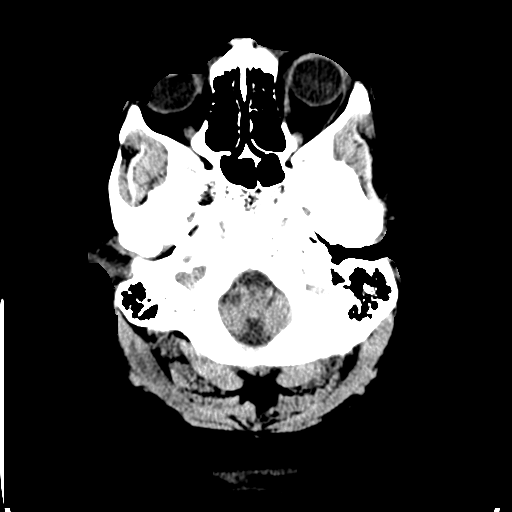
[im 6/32  brain]
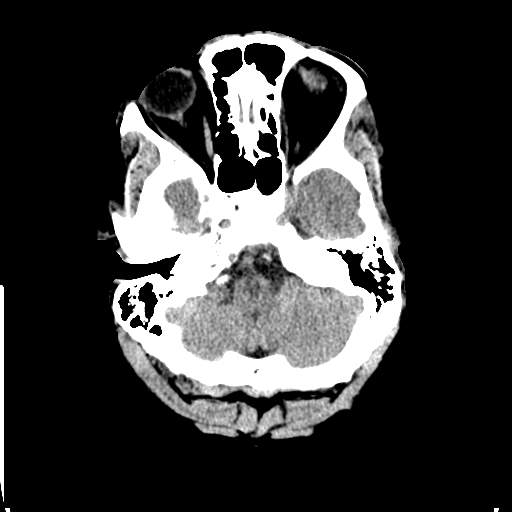
[im 8/32  brain]
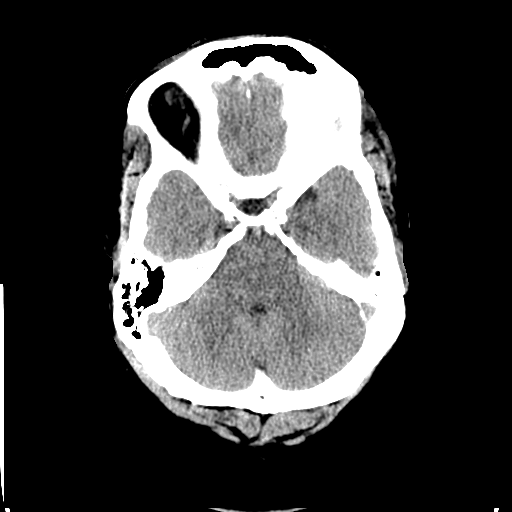
[im 9/32  brain]
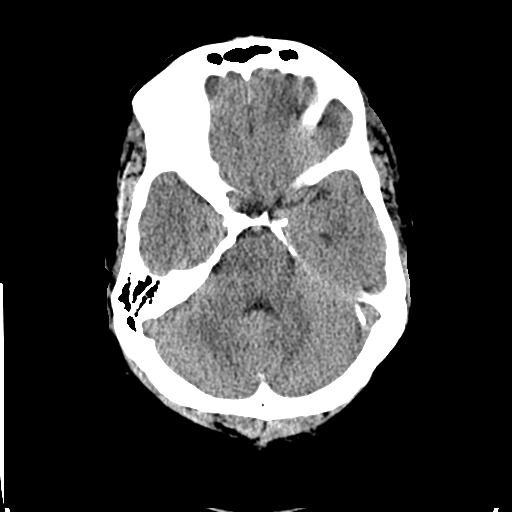
[im 9/32  bone]
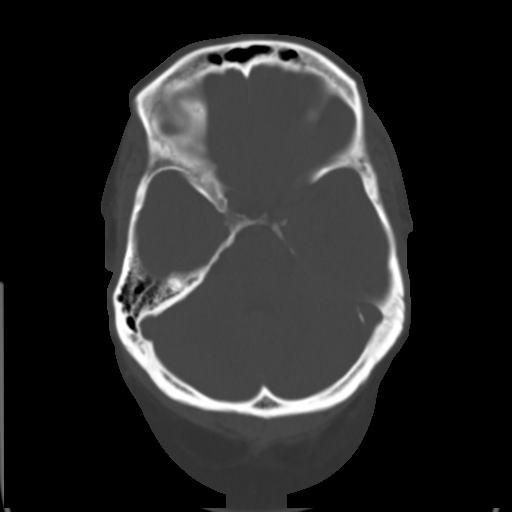
[im 11/32  brain]
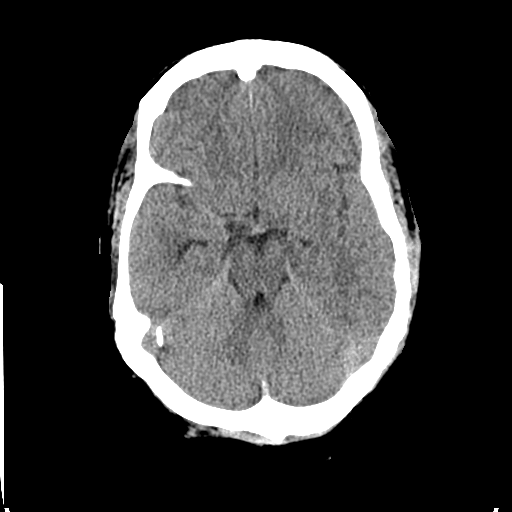
[im 13/32  brain]
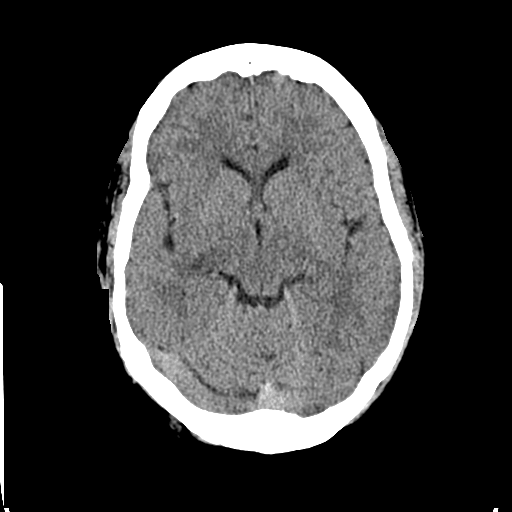
[im 15/32  brain]
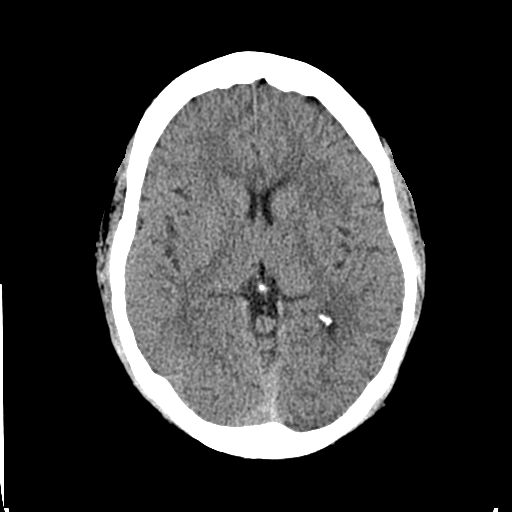
[im 17/32  brain]
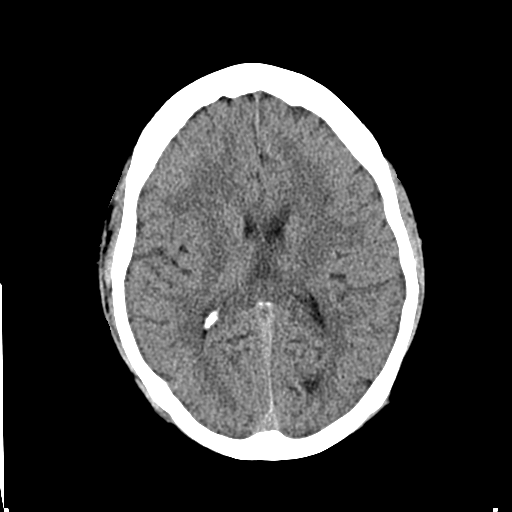
[im 17/32  bone]
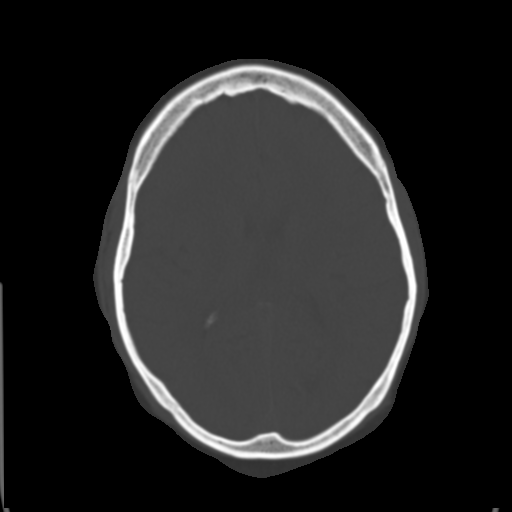
[im 19/32  brain]
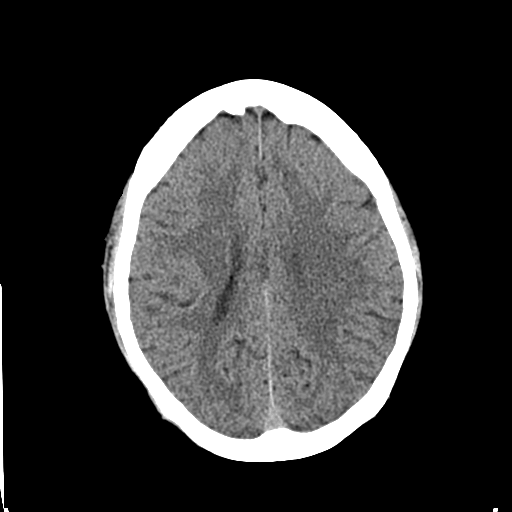
[im 21/32  brain]
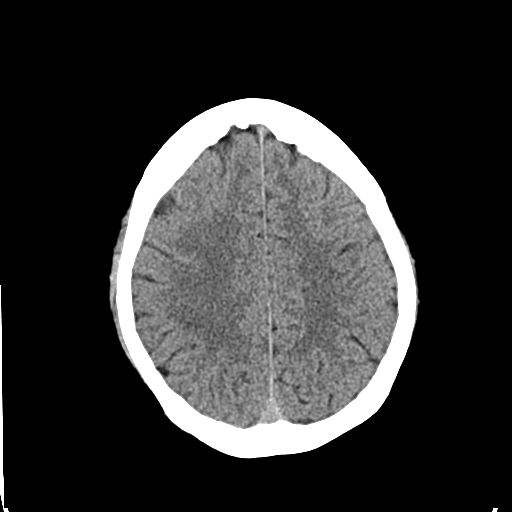
[im 23/32  brain]
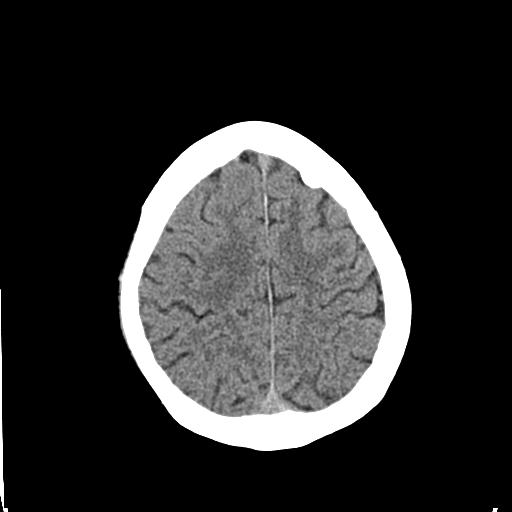
[im 24/32  brain]
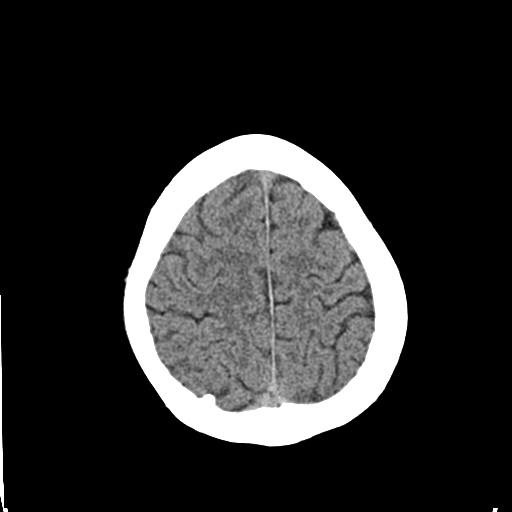
[im 24/32  bone]
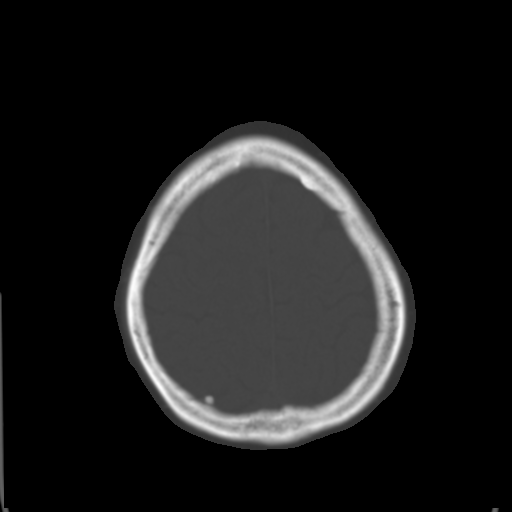
[im 26/32  brain]
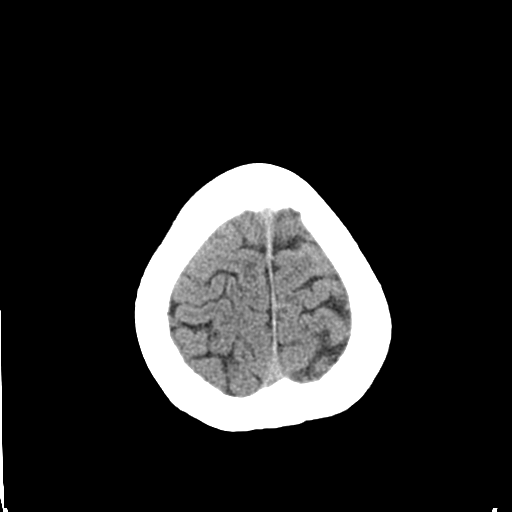
[im 28/32  brain]
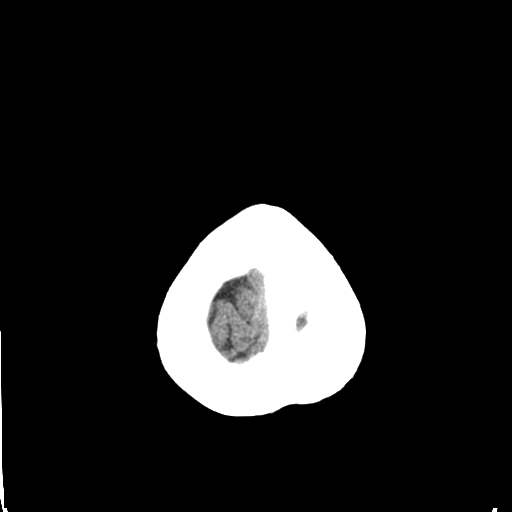
[im 30/32  brain]
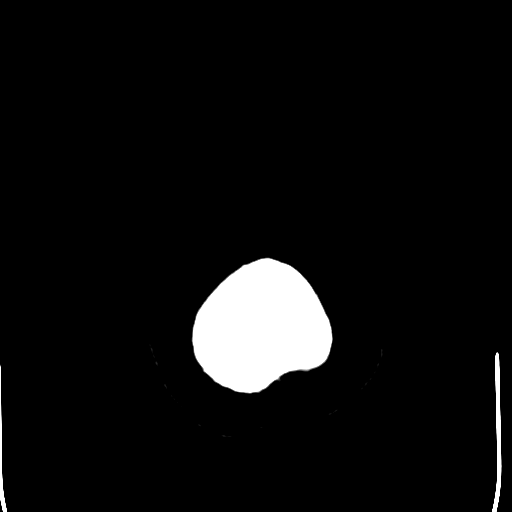

[16 of 30 positions shown; findings below may reference images not displayed]

FINDINGS: No evidence for acute infarction, hemorrhage, mass lesion,
hydrocephalus, or extra-axial fluid. No atrophy or white matter
disease. Intact calvarium. Bifrontal hyperostosis. No acute sinus or
mastoid disease. Deformed LEFT globe consistent with the history of
blindness.
IMPRESSION: Negative exam.  No change from priors.

## 2015-11-16 IMAGING — CR DG CHEST 2V
2 series · 2 of 2 positions shown · non-contrast
Comparison: 05/05/2014

CLINICAL DATA: Chest pain.

EXAM:
CHEST  2 VIEW

[w chest pa]
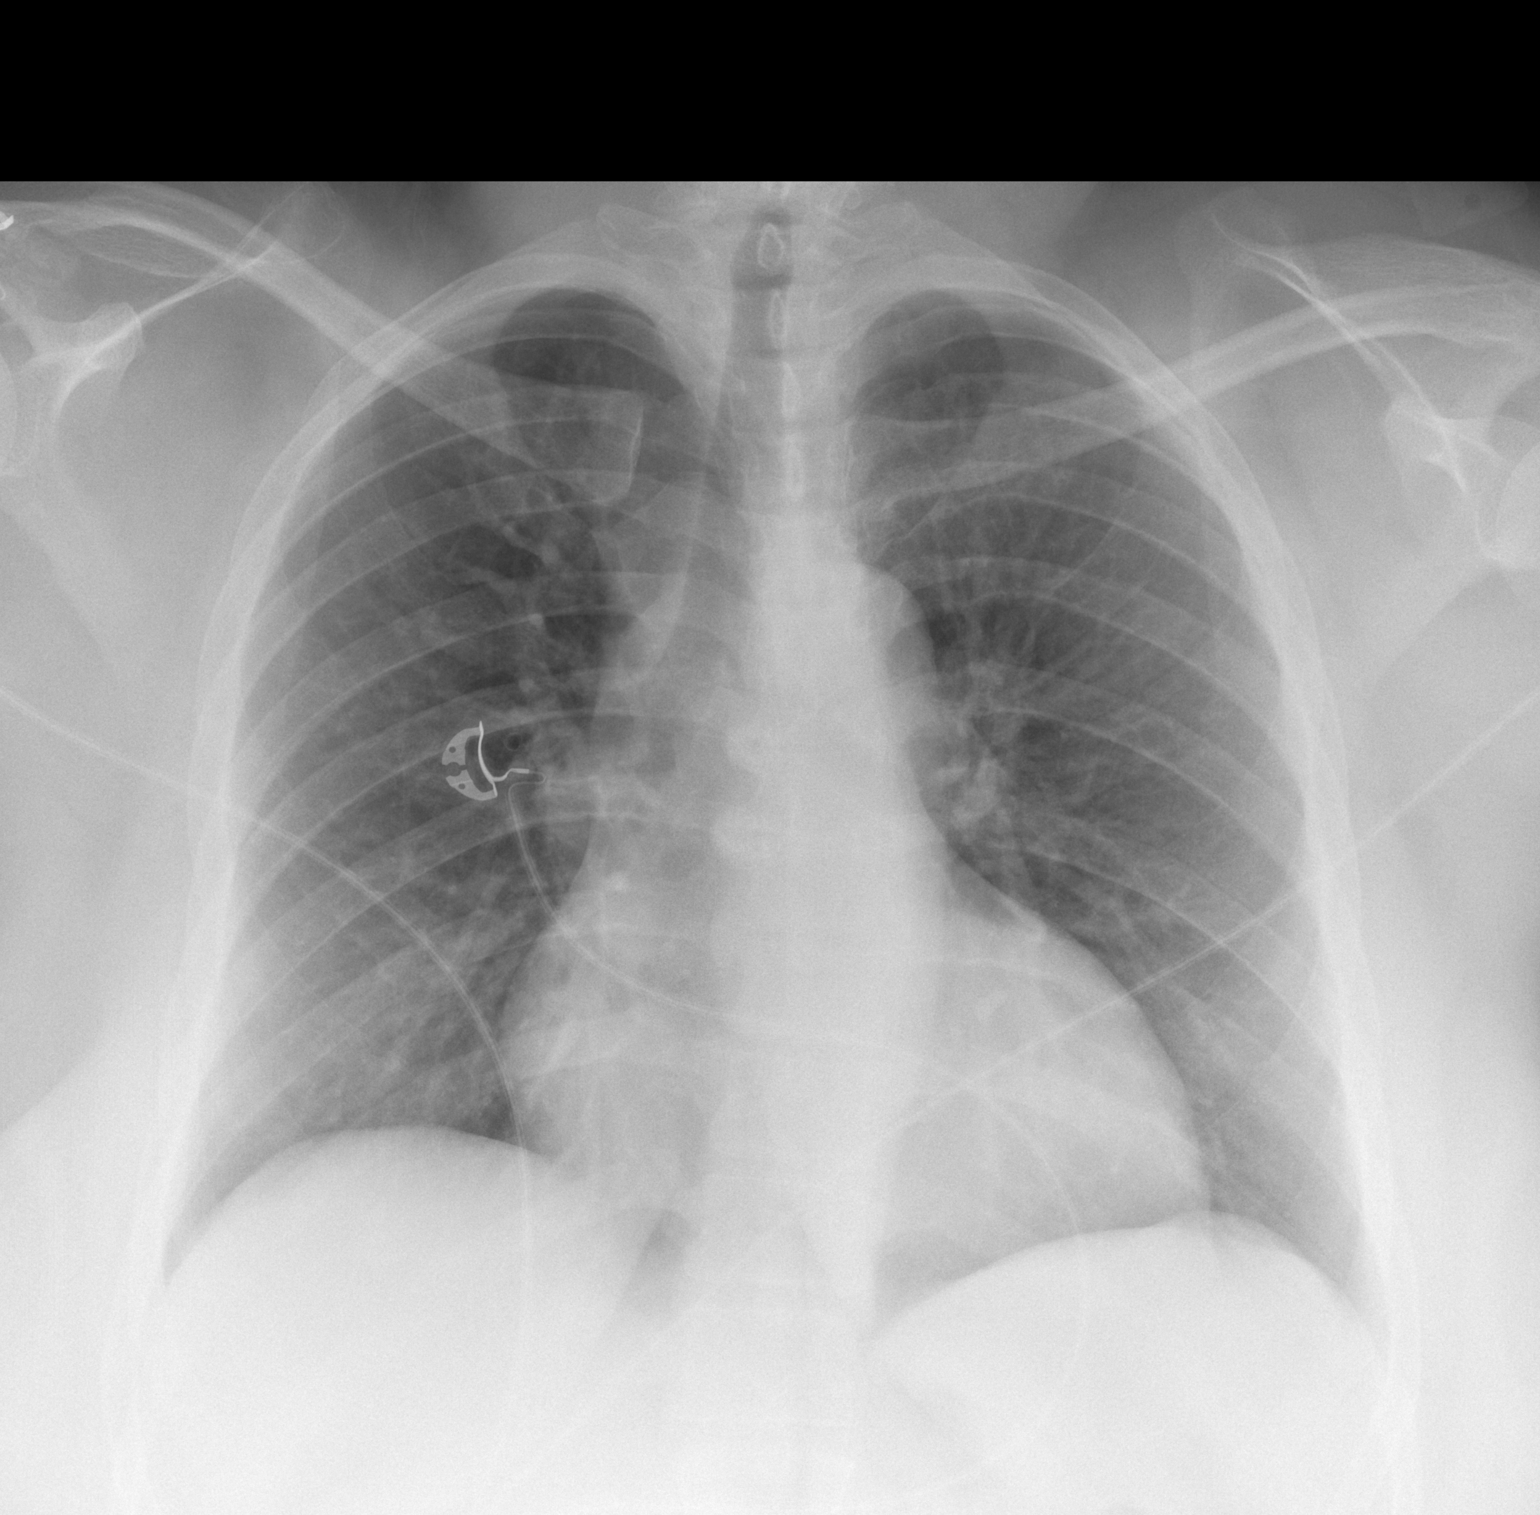

[w chest lat]
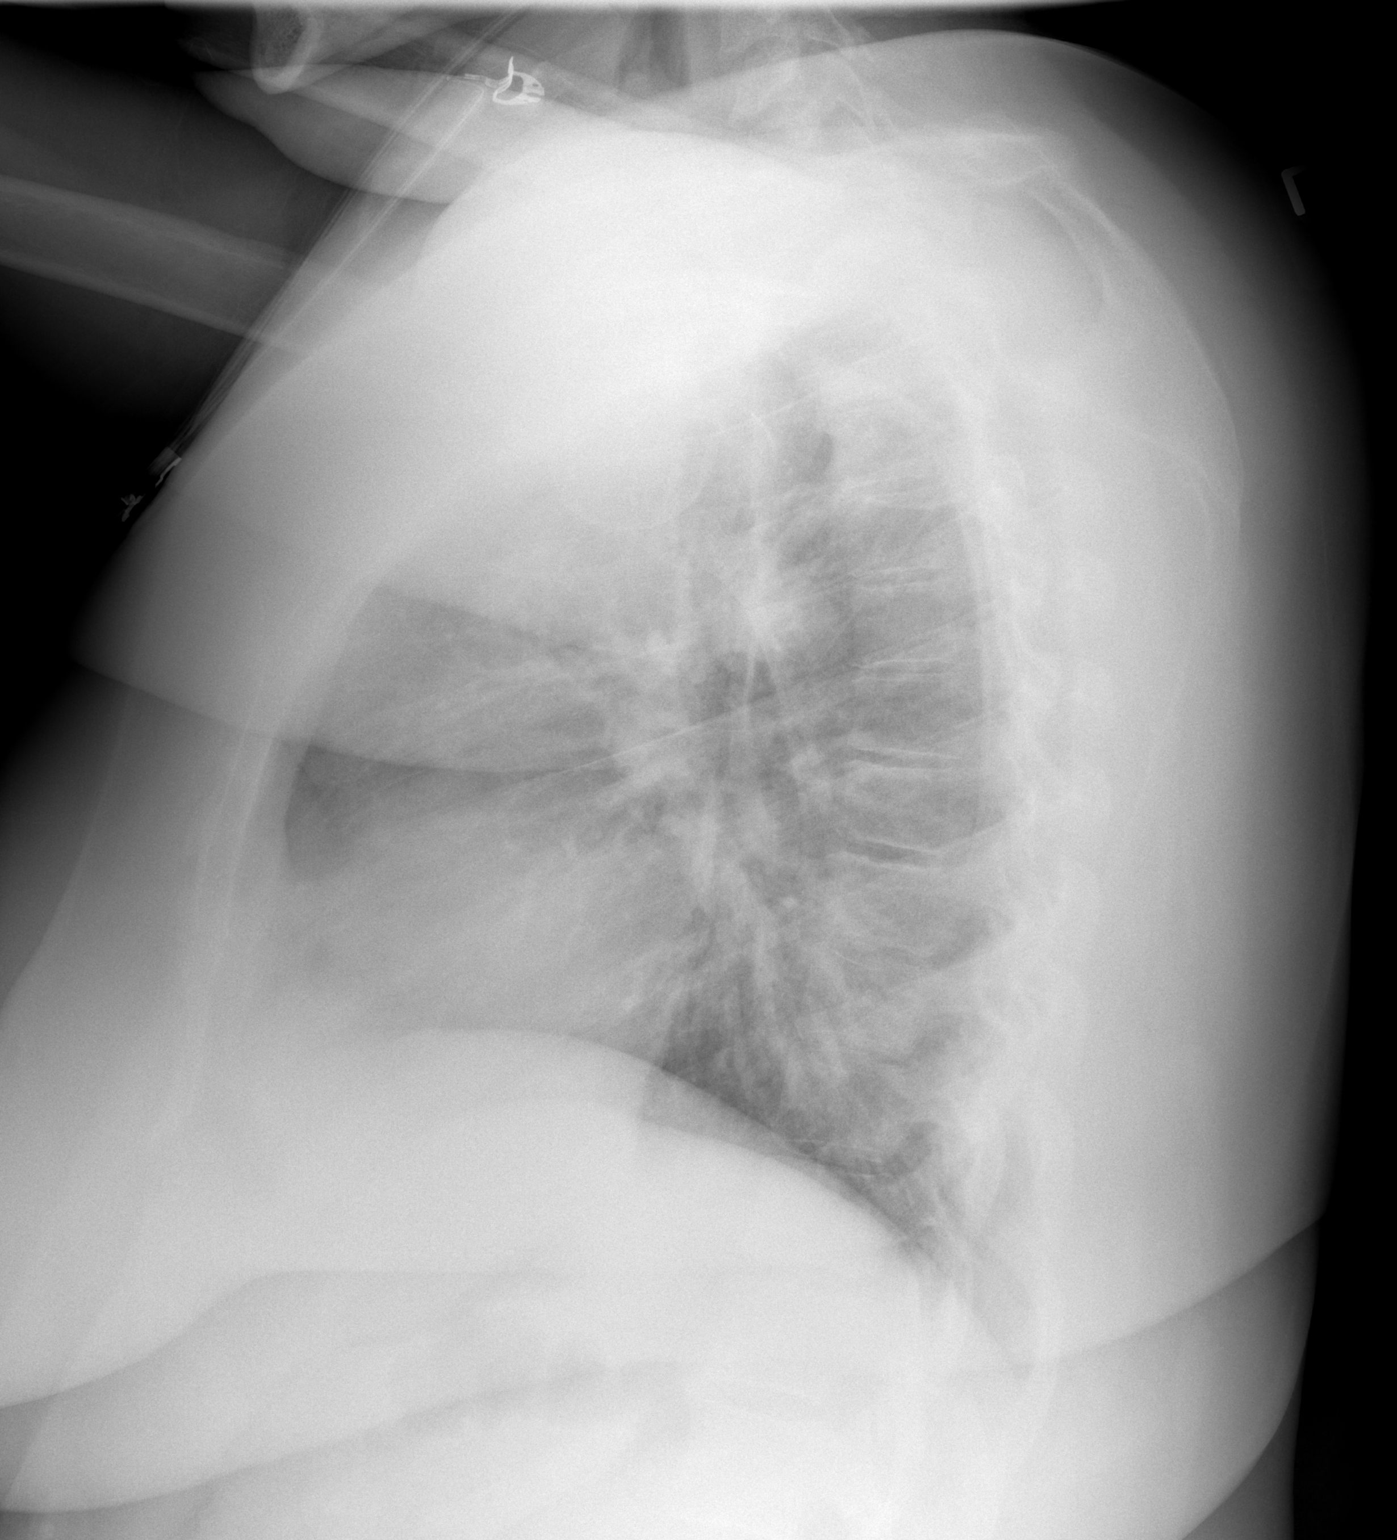

[2 of 2 positions shown; findings below may reference images not displayed]

FINDINGS: Normal heart size and mediastinal contours. No acute infiltrate or
edema. No effusion or pneumothorax. No acute osseous findings.
IMPRESSION: No active cardiopulmonary disease.

## 2016-07-31 ENCOUNTER — Other Ambulatory Visit: Payer: Self-pay | Admitting: Obstetrics and Gynecology
